# Patient Record
Sex: Female | Born: 1970 | Race: White | Hispanic: No | Marital: Married | Smoking: Current every day smoker
Health system: Southern US, Community
[De-identification: ages and names within clinical notes are randomized; demographics above are authoritative.]

## PROBLEM LIST (undated history)

## (undated) DIAGNOSIS — R42 Dizziness and giddiness: Secondary | ICD-10-CM

## (undated) DIAGNOSIS — J45909 Unspecified asthma, uncomplicated: Secondary | ICD-10-CM

## (undated) DIAGNOSIS — Z9889 Other specified postprocedural states: Secondary | ICD-10-CM

## (undated) DIAGNOSIS — C50919 Malignant neoplasm of unspecified site of unspecified female breast: Secondary | ICD-10-CM

## (undated) DIAGNOSIS — R413 Other amnesia: Secondary | ICD-10-CM

## (undated) DIAGNOSIS — Z9989 Dependence on other enabling machines and devices: Secondary | ICD-10-CM

## (undated) DIAGNOSIS — Z87898 Personal history of other specified conditions: Secondary | ICD-10-CM

## (undated) DIAGNOSIS — I951 Orthostatic hypotension: Secondary | ICD-10-CM

## (undated) DIAGNOSIS — R9089 Other abnormal findings on diagnostic imaging of central nervous system: Secondary | ICD-10-CM

## (undated) DIAGNOSIS — M1990 Unspecified osteoarthritis, unspecified site: Secondary | ICD-10-CM

## (undated) DIAGNOSIS — J189 Pneumonia, unspecified organism: Secondary | ICD-10-CM

## (undated) DIAGNOSIS — G459 Transient cerebral ischemic attack, unspecified: Secondary | ICD-10-CM

## (undated) DIAGNOSIS — E1140 Type 2 diabetes mellitus with diabetic neuropathy, unspecified: Secondary | ICD-10-CM

## (undated) DIAGNOSIS — K219 Gastro-esophageal reflux disease without esophagitis: Secondary | ICD-10-CM

## (undated) DIAGNOSIS — K589 Irritable bowel syndrome without diarrhea: Secondary | ICD-10-CM

## (undated) DIAGNOSIS — F418 Other specified anxiety disorders: Secondary | ICD-10-CM

## (undated) DIAGNOSIS — R600 Localized edema: Secondary | ICD-10-CM

## (undated) DIAGNOSIS — R011 Cardiac murmur, unspecified: Secondary | ICD-10-CM

## (undated) DIAGNOSIS — I219 Acute myocardial infarction, unspecified: Secondary | ICD-10-CM

## (undated) DIAGNOSIS — E119 Type 2 diabetes mellitus without complications: Secondary | ICD-10-CM

## (undated) DIAGNOSIS — R296 Repeated falls: Secondary | ICD-10-CM

## (undated) DIAGNOSIS — R569 Unspecified convulsions: Secondary | ICD-10-CM

## (undated) DIAGNOSIS — E669 Obesity, unspecified: Secondary | ICD-10-CM

## (undated) DIAGNOSIS — Z87442 Personal history of urinary calculi: Secondary | ICD-10-CM

## (undated) DIAGNOSIS — G43909 Migraine, unspecified, not intractable, without status migrainosus: Secondary | ICD-10-CM

## (undated) DIAGNOSIS — E66811 Obesity, class 1: Secondary | ICD-10-CM

## (undated) DIAGNOSIS — R112 Nausea with vomiting, unspecified: Secondary | ICD-10-CM

## (undated) DIAGNOSIS — F411 Generalized anxiety disorder: Secondary | ICD-10-CM

## (undated) DIAGNOSIS — Z8709 Personal history of other diseases of the respiratory system: Secondary | ICD-10-CM

## (undated) DIAGNOSIS — Z8619 Personal history of other infectious and parasitic diseases: Secondary | ICD-10-CM

## (undated) DIAGNOSIS — F410 Panic disorder [episodic paroxysmal anxiety] without agoraphobia: Secondary | ICD-10-CM

## (undated) HISTORY — PX: PLANTAR'S WART EXCISION: SHX2240

## (undated) HISTORY — DX: Gastro-esophageal reflux disease without esophagitis: K21.9

## (undated) HISTORY — DX: Obesity, unspecified: E66.9

## (undated) HISTORY — PX: ABDOMINAL HYSTERECTOMY: SHX81

## (undated) HISTORY — PX: CERVICAL CONE BIOPSY: SUR198

## (undated) HISTORY — PX: DIAGNOSTIC LAPAROSCOPY: SUR761

## (undated) HISTORY — DX: Generalized anxiety disorder: F41.1

## (undated) HISTORY — DX: Transient cerebral ischemic attack, unspecified: G45.9

## (undated) HISTORY — DX: Obesity, class 1: E66.811

## (undated) HISTORY — DX: Panic disorder (episodic paroxysmal anxiety): F41.0

## (undated) HISTORY — DX: Malignant neoplasm of unspecified site of unspecified female breast: C50.919

## (undated) HISTORY — DX: Type 2 diabetes mellitus without complications: E11.9

## (undated) HISTORY — PX: BREAST LUMPECTOMY: SHX2

## (undated) HISTORY — DX: Personal history of urinary calculi: Z87.442

## (undated) HISTORY — DX: Other abnormal findings on diagnostic imaging of central nervous system: R90.89

## (undated) HISTORY — PX: APPENDECTOMY: SHX54

## (undated) HISTORY — DX: Irritable bowel syndrome, unspecified: K58.9

## (undated) HISTORY — DX: Personal history of other diseases of the respiratory system: Z87.09

## (undated) HISTORY — DX: Personal history of other specified conditions: Z87.898

## (undated) HISTORY — DX: Migraine, unspecified, not intractable, without status migrainosus: G43.909

## (undated) HISTORY — DX: Other amnesia: R41.3

## (undated) HISTORY — DX: Type 2 diabetes mellitus with diabetic neuropathy, unspecified: E11.40

## (undated) HISTORY — PX: MULTIPLE TOOTH EXTRACTIONS: SHX2053

## (undated) HISTORY — PX: CHOLECYSTECTOMY: SHX55

## (undated) HISTORY — DX: Dizziness and giddiness: R42

## (undated) HISTORY — DX: Other specified anxiety disorders: F41.8

## (undated) HISTORY — DX: Repeated falls: R29.6

## (undated) HISTORY — DX: Orthostatic hypotension: I95.1

## (undated) HISTORY — PX: CYST REMOVAL HAND: SHX6279

## (undated) HISTORY — DX: Unspecified convulsions: R56.9

## (undated) HISTORY — DX: Localized edema: R60.0

## (undated) HISTORY — PX: TUBAL LIGATION: SHX77

---

## 1990-07-19 HISTORY — PX: APPENDECTOMY: SHX54

## 1990-07-19 HISTORY — PX: KNEE ARTHROSCOPY: SHX127

## 2001-07-19 HISTORY — PX: CHOLECYSTECTOMY: SHX55

## 2002-07-19 HISTORY — PX: ABDOMINAL HYSTERECTOMY: SHX81

## 2002-07-19 HISTORY — PX: ANKLE ARTHROPLASTY: SUR68

## 2011-07-03 NOTE — H&P (Signed)
Holly Adams is an 40 y.o. female.   Chief Complaint: Right wrist pain. HPI: Pt c/o work related pain to right wrist. She has had treatment for CRPS and is left with FCR pain and pain at the first dorsal compartment. She can not grip without pain and must wear a brace.  No past medical history on file.  No past surgical history on file.  No family history on file. Social History:  does not have a smoking history on file. She does not have any smokeless tobacco history on file. Her alcohol and drug histories not on file.  Allergies: Allergies not on file  No current facility-administered medications on file as of .   No current outpatient prescriptions on file as of .    No results found for this or any previous visit (from the past 48 hour(s)). No results found.  Review of Systems  Constitutional: Negative.   HENT: Negative.   Eyes: Negative.   Respiratory: Negative.   Cardiovascular: Negative.   Gastrointestinal: Negative.   Genitourinary: Negative.   Musculoskeletal: Negative.   Skin: Negative.   Neurological: Negative.   Psychiatric/Behavioral: Negative.     There were no vitals taken for this visit. Physical Exam  Constitutional: She is oriented to person, place, and time. She appears well-developed and well-nourished.  HENT:  Head: Normocephalic and atraumatic.  Eyes: Conjunctivae are normal. Pupils are equal, round, and reactive to light.  Neck: Normal range of motion.  Cardiovascular: Normal rate and regular rhythm.   Respiratory: Effort normal.  GI: Soft.  Musculoskeletal:       Right wrist pain over FCR. Positive Finkelstein test. Peri Jefferson n/v status  Neurological: She is alert and oriented to person, place, and time.  Skin: Skin is warm.  Psychiatric: She has a normal mood and affect.     Assessment/Plan Right first dorsal compartment tendonitis and FCR pain. Plan- is to release first dorsal comp and explore the FCR in an effort to reduce her  pain.  Rosslyn Pasion R 07/03/2011, 11:08 AM

## 2011-07-05 ENCOUNTER — Encounter (HOSPITAL_BASED_OUTPATIENT_CLINIC_OR_DEPARTMENT_OTHER): Payer: Self-pay | Admitting: *Deleted

## 2011-07-05 NOTE — H&P (Signed)
Holly Adams is an 40 y.o. female.   Chief Complaint: Right wrist pain HPI: Pt c/o of r wrist pain. This is a work related injury . She has failed nsaid and therapy and has continued r wrist pain. She has had an issue with CRPS requiring blocks. We have offered surgical release for thi problem.  Past Medical History  Diagnosis Date  . PONV (postoperative nausea and vomiting)   . Celiac disease   . History of shingles     Past Surgical History  Procedure Date  . Knee arthroscopy 1992    lt   . Abdominal hysterectomy   . Appendectomy   . Cholecystectomy   . Tubal ligation   . Diagnostic laparoscopy     tubal preg  . Ankle arthroplasty 2004    fx ankle-rods and pins-lt  . Plantar's wart excision   . Cervical cone biopsy     History reviewed. No pertinent family history. Social History:  reports that she has been smoking.  She does not have any smokeless tobacco history on file. She reports that she drinks alcohol. She reports that she does not use illicit drugs.  Allergies:  Allergies  Allergen Reactions  . Contrast Adams (Iodinated Diagnostic Agents) Anaphylaxis  . Latex Shortness Of Breath  . Oxycontin Shortness Of Breath  . Ibuprofen Swelling  . Other     Hydrogen peroxide--raw shin Celiac disease-no glutiens  . Zithromax (Azithromycin Dihydrate) Rash    No current facility-administered medications on file as of .   Medications Prior to Admission  Medication Sig Dispense Refill  . acyclovir (ZOVIRAX) 800 MG tablet Take 800 mg by mouth 5 (five) times daily. She had shingles months ago-felt like they were coming back-started on meds before blister showed up-no s/s       . HYDROcodone-acetaminophen (VICODIN) 5-500 MG per tablet Take 1 tablet by mouth every 6 (six) hours as needed.        . topiramate (TOPAMAX) 50 MG tablet Take 100 mg by mouth 2 (two) times daily.          No results found for this or any previous visit (from the past 48 hour(s)). No results  found.  Review of Systems  Constitutional: Negative.   HENT: Negative.   Eyes: Negative.   Respiratory: Negative.   Cardiovascular: Negative.   Gastrointestinal: Negative.   Genitourinary: Negative.   Musculoskeletal: Negative.   Skin: Negative.   Neurological: Negative.   Endo/Heme/Allergies: Negative.   Psychiatric/Behavioral: Negative.     There were no vitals taken for this visit. Physical Exam  Constitutional: She is oriented to person, place, and time. She appears well-developed and well-nourished.  HENT:  Head: Normocephalic and atraumatic.  Eyes: Conjunctivae are normal. Pupils are equal, round, and reactive to light.  Neck: Normal range of motion. Neck supple.  Cardiovascular: Normal rate and regular rhythm.   Respiratory: Effort normal and breath sounds normal.  GI: Soft. Bowel sounds are normal.  Musculoskeletal:       R wrist- pain in first dorsal comp. There is a positive finklestein test as well FCR pain  Neurological: She is alert and oriented to person, place, and time. She has normal reflexes.  Skin: Skin is warm and dry.  Psychiatric: She has a normal mood and affect.     Assessment/Plan r de Quervains and FCR pain PLAN-r wrist first dorsal comp release and FCR synovectomy to improve pt's pain  Lysha Schrade R 07/05/2011, 5:55 PM

## 2011-07-05 NOTE — Progress Notes (Signed)
Arrive 6am with boyfriend No labs needed

## 2011-07-06 ENCOUNTER — Encounter (HOSPITAL_BASED_OUTPATIENT_CLINIC_OR_DEPARTMENT_OTHER): Payer: Self-pay | Admitting: *Deleted

## 2011-07-06 ENCOUNTER — Ambulatory Visit (HOSPITAL_BASED_OUTPATIENT_CLINIC_OR_DEPARTMENT_OTHER): Payer: Worker's Compensation | Admitting: Anesthesiology

## 2011-07-06 ENCOUNTER — Encounter (HOSPITAL_BASED_OUTPATIENT_CLINIC_OR_DEPARTMENT_OTHER): Payer: Self-pay | Admitting: Anesthesiology

## 2011-07-06 ENCOUNTER — Encounter (HOSPITAL_BASED_OUTPATIENT_CLINIC_OR_DEPARTMENT_OTHER)
Admission: RE | Disposition: A | Discharge status: Home / Self Care | Payer: Self-pay | Source: Ambulatory Visit | Attending: Orthopaedic Surgery

## 2011-07-06 ENCOUNTER — Ambulatory Visit (HOSPITAL_BASED_OUTPATIENT_CLINIC_OR_DEPARTMENT_OTHER)
Admission: RE | Admit: 2011-07-06 | Discharge: 2011-07-06 | Disposition: A | Discharge status: Home / Self Care | Payer: Worker's Compensation | Source: Ambulatory Visit | Attending: Orthopaedic Surgery | Admitting: Orthopaedic Surgery

## 2011-07-06 DIAGNOSIS — Z01812 Encounter for preprocedural laboratory examination: Secondary | ICD-10-CM | POA: Insufficient documentation

## 2011-07-06 DIAGNOSIS — M65839 Other synovitis and tenosynovitis, unspecified forearm: Secondary | ICD-10-CM | POA: Insufficient documentation

## 2011-07-06 DIAGNOSIS — M654 Radial styloid tenosynovitis [de Quervain]: Secondary | ICD-10-CM | POA: Insufficient documentation

## 2011-07-06 HISTORY — DX: Personal history of other infectious and parasitic diseases: Z86.19

## 2011-07-06 HISTORY — DX: Other specified postprocedural states: R11.2

## 2011-07-06 HISTORY — DX: Other specified postprocedural states: Z98.890

## 2011-07-06 HISTORY — PX: DORSAL COMPARTMENT RELEASE: SHX5039

## 2011-07-06 LAB — POCT HEMOGLOBIN-HEMACUE: Hemoglobin: 14 g/dL (ref 12.0–15.0)

## 2011-07-06 SURGERY — RELEASE, FIRST DORSAL COMPARTMENT, HAND
Anesthesia: General | Site: Arm Lower | Laterality: Right | Wound class: Clean

## 2011-07-06 MED ORDER — LIDOCAINE HCL (CARDIAC) 20 MG/ML IV SOLN
INTRAVENOUS | Status: DC | PRN
  Administered 2011-07-06: 80 mg via INTRAVENOUS

## 2011-07-06 MED ORDER — MIDAZOLAM HCL 5 MG/5ML IJ SOLN
INTRAMUSCULAR | Status: DC | PRN
  Administered 2011-07-06: 2 mg via INTRAVENOUS

## 2011-07-06 MED ORDER — MEPERIDINE HCL 25 MG/ML IJ SOLN: 6.2500 mg | INTRAMUSCULAR | Status: DC | PRN

## 2011-07-06 MED ORDER — PROMETHAZINE HCL 25 MG/ML IJ SOLN: 6.2500 mg | INTRAMUSCULAR | Status: DC | PRN

## 2011-07-06 MED ORDER — BUPIVACAINE HCL (PF) 0.25 % IJ SOLN
INTRAMUSCULAR | Status: DC | PRN
  Administered 2011-07-06: 6 mL

## 2011-07-06 MED ORDER — FENTANYL CITRATE 0.05 MG/ML IJ SOLN
INTRAMUSCULAR | Status: DC | PRN
  Administered 2011-07-06 (×2): 50 ug via INTRAVENOUS
  Administered 2011-07-06: 100 ug via INTRAVENOUS

## 2011-07-06 MED ORDER — LACTATED RINGERS IV SOLN
INTRAVENOUS | Status: DC
  Administered 2011-07-06 (×2): via INTRAVENOUS

## 2011-07-06 MED ORDER — PROPOFOL 10 MG/ML IV EMUL
INTRAVENOUS | Status: DC | PRN
  Administered 2011-07-06: 200 mg via INTRAVENOUS

## 2011-07-06 MED ORDER — DEXAMETHASONE SODIUM PHOSPHATE 4 MG/ML IJ SOLN
INTRAMUSCULAR | Status: DC | PRN
  Administered 2011-07-06: 10 mg via INTRAVENOUS

## 2011-07-06 MED ORDER — CEFAZOLIN SODIUM 1-5 GM-% IV SOLN
INTRAVENOUS | Status: DC | PRN
  Administered 2011-07-06: 2 g via INTRAVENOUS

## 2011-07-06 MED ORDER — HYDROCODONE-ACETAMINOPHEN 5-325 MG PO TABS: 1.0000 | ORAL_TABLET | Freq: Four times a day (QID) | ORAL | Status: AC | PRN

## 2011-07-06 MED ORDER — HYDROMORPHONE HCL PF 1 MG/ML IJ SOLN
0.2500 mg | INTRAMUSCULAR | Status: DC | PRN
  Administered 2011-07-06 (×3): 0.5 mg via INTRAVENOUS

## 2011-07-06 MED ORDER — ONDANSETRON HCL 4 MG/2ML IJ SOLN
INTRAMUSCULAR | Status: DC | PRN
  Administered 2011-07-06: 4 mg via INTRAVENOUS

## 2011-07-06 SURGICAL SUPPLY — 57 items
BANDAGE ACE 4 STERILE (GAUZE/BANDAGES/DRESSINGS) ×2 IMPLANT
BANDAGE ELASTIC 3 VELCRO ST LF (GAUZE/BANDAGES/DRESSINGS) IMPLANT
BANDAGE ELASTIC 4 VELCRO ST LF (GAUZE/BANDAGES/DRESSINGS) IMPLANT
BANDAGE GAUZE ELAST BULKY 4 IN (GAUZE/BANDAGES/DRESSINGS) ×2 IMPLANT
BLADE MINI RND TIP GREEN BEAV (BLADE) ×2 IMPLANT
BLADE SURG 15 STRL LF DISP TIS (BLADE) ×1 IMPLANT
BLADE SURG 15 STRL SS (BLADE) ×1
BNDG ESMARK 4X9 LF (GAUZE/BANDAGES/DRESSINGS) IMPLANT
COVER MAYO STAND STRL (DRAPES) ×2 IMPLANT
COVER TABLE BACK 60X90 (DRAPES) ×2 IMPLANT
CUFF TOURNIQUET SINGLE 18IN (TOURNIQUET CUFF) IMPLANT
DECANTER SPIKE VIAL GLASS SM (MISCELLANEOUS) IMPLANT
DRAPE EXTREMITY T 121X128X90 (DRAPE) ×2 IMPLANT
DRAPE U-SHAPE 47X51 STRL (DRAPES) ×2 IMPLANT
DRSG EMULSION OIL 3X3 NADH (GAUZE/BANDAGES/DRESSINGS) ×2 IMPLANT
DURAPREP 26ML APPLICATOR (WOUND CARE) ×2 IMPLANT
ELECT REM PT RETURN 9FT ADLT (ELECTROSURGICAL) ×2
ELECTRODE REM PT RTRN 9FT ADLT (ELECTROSURGICAL) ×1 IMPLANT
GAUZE SPONGE 4X4 16PLY XRAY LF (GAUZE/BANDAGES/DRESSINGS) IMPLANT
GLOVE BIO SURGEON STRL SZ8.5 (GLOVE) IMPLANT
GLOVE BIOGEL PI IND STRL 7.0 (GLOVE) ×1 IMPLANT
GLOVE BIOGEL PI IND STRL 8 (GLOVE) ×1 IMPLANT
GLOVE BIOGEL PI IND STRL 8.5 (GLOVE) ×1 IMPLANT
GLOVE BIOGEL PI INDICATOR 7.0 (GLOVE) ×1
GLOVE BIOGEL PI INDICATOR 8 (GLOVE) ×1
GLOVE BIOGEL PI INDICATOR 8.5 (GLOVE) ×1
GLOVE SS BIOGEL STRL SZ 8 (GLOVE) IMPLANT
GLOVE SUPERSENSE BIOGEL SZ 8 (GLOVE)
GOWN PREVENTION PLUS XLARGE (GOWN DISPOSABLE) ×2 IMPLANT
GOWN PREVENTION PLUS XXLARGE (GOWN DISPOSABLE) ×2 IMPLANT
NEEDLE HYPO 25X1 1.5 SAFETY (NEEDLE) ×2 IMPLANT
NS IRRIG 1000ML POUR BTL (IV SOLUTION) ×2 IMPLANT
PACK BASIN DAY SURGERY FS (CUSTOM PROCEDURE TRAY) ×2 IMPLANT
PAD CAST 3X4 CTTN HI CHSV (CAST SUPPLIES) ×1 IMPLANT
PADDING CAST ABS 4INX4YD NS (CAST SUPPLIES) ×1
PADDING CAST ABS COTTON 4X4 ST (CAST SUPPLIES) ×1 IMPLANT
PADDING CAST COTTON 3X4 STRL (CAST SUPPLIES) ×1
PADDING WEBRIL 4 STERILE (GAUZE/BANDAGES/DRESSINGS) ×2 IMPLANT
PENCIL BUTTON HOLSTER BLD 10FT (ELECTRODE) IMPLANT
SHEET MEDIUM DRAPE 40X70 STRL (DRAPES) IMPLANT
SLEEVE SCD COMPRESS KNEE MED (MISCELLANEOUS) IMPLANT
SPLINT PLASTER CAST XFAST 3X15 (CAST SUPPLIES) IMPLANT
SPLINT PLASTER EXTRA FAST 3X15 (CAST SUPPLIES) ×1
SPLINT PLASTER GYPS XFAST 3X15 (CAST SUPPLIES) ×1 IMPLANT
SPLINT PLASTER XTRA FASTSET 3X (CAST SUPPLIES)
SPONGE GAUZE 4X4 12PLY (GAUZE/BANDAGES/DRESSINGS) ×2 IMPLANT
SPONGE GAUZE 4X4 16PLY NONSTR (GAUZE/BANDAGES/DRESSINGS) ×2 IMPLANT
STOCKINETTE 4X48 STRL (DRAPES) ×2 IMPLANT
SUT ETHILON 4 0 PS 2 18 (SUTURE) ×4 IMPLANT
SUT VIC AB 4-0 P-3 18XBRD (SUTURE) IMPLANT
SUT VIC AB 4-0 P3 18 (SUTURE)
SYR BULB 3OZ (MISCELLANEOUS) ×2 IMPLANT
SYR CONTROL 10ML LL (SYRINGE) ×2 IMPLANT
TOWEL OR 17X24 6PK STRL BLUE (TOWEL DISPOSABLE) ×4 IMPLANT
TOWEL OR NON WOVEN STRL DISP B (DISPOSABLE) ×2 IMPLANT
UNDERPAD 30X30 INCONTINENT (UNDERPADS AND DIAPERS) ×2 IMPLANT
WATER STERILE IRR 1000ML POUR (IV SOLUTION) ×2 IMPLANT

## 2011-07-06 NOTE — Anesthesia Preprocedure Evaluation (Signed)
Anesthesia Evaluation  Patient identified by MRN, date of birth, ID band Patient awake    History of Anesthesia Complications (+) PONV  Airway Mallampati: II TM Distance: >3 FB Neck ROM: full    Dental  (+) Teeth Intact and Dental Advidsory Given   Pulmonary neg pulmonary ROS,  clear to auscultation        Cardiovascular regular Normal    Neuro/Psych    GI/Hepatic negative GI ROS,   Endo/Other    Renal/GU      Musculoskeletal   Abdominal   Peds  Hematology   Anesthesia Other Findings   Reproductive/Obstetrics                           Anesthesia Physical Anesthesia Plan  ASA: II  Anesthesia Plan: General LMA   Post-op Pain Management:    Induction:   Airway Management Planned:   Additional Equipment:   Intra-op Plan:   Post-operative Plan:   Informed Consent: I have reviewed the patients History and Physical, chart, labs and discussed the procedure including the risks, benefits and alternatives for the proposed anesthesia with the patient or authorized representative who has indicated his/her understanding and acceptance.   Dental Advisory Given  Plan Discussed with: CRNA and Surgeon  Anesthesia Plan Comments:         Anesthesia Quick Evaluation

## 2011-07-06 NOTE — Brief Op Note (Signed)
Holly Adams [id number] 07/06/2011   PRE-OP DIAGNOSIS: right DeQuervains and FCR tendonitis  POST-OP DIAGNOSIS: same  PROCEDURE: right DeQ release and FCR debridement  ANESTHESIA: general  Nicky Kras G   Dictation #:  [id number]

## 2011-07-06 NOTE — Interval H&P Note (Signed)
History and Physical Interval Note:  07/06/2011 7:33 AM  Holly Adams  has presented today for surgery, with the diagnosis of right dequervains,  The various methods of treatment have been discussed with the patient and family. After consideration of risks, benefits and other options for treatment, the patient has consented to  Procedure(s): RELEASE DORSAL COMPARTMENT (DEQUERVAIN) as a surgical intervention .  The patients' history has been reviewed, patient examined, no change in status, stable for surgery.  I have reviewed the patients' chart and labs.  Questions were answered to the patient's satisfaction.     Arian Murley G

## 2011-07-06 NOTE — Anesthesia Procedure Notes (Signed)
Procedure Name: LMA Insertion Date/Time: 07/06/2011 7:45 AM Performed by: Signa Kell Pre-anesthesia Checklist: Patient identified, Emergency Drugs available, Suction available and Patient being monitored Patient Re-evaluated:Patient Re-evaluated prior to inductionOxygen Delivery Method: Circle System Utilized Preoxygenation: Pre-oxygenation with 100% oxygen Intubation Type: IV induction Ventilation: Mask ventilation without difficulty LMA: LMA inserted LMA Size: 4.0 Number of attempts: 1 Airway Equipment and Method: bite block Placement Confirmation: positive ETCO2 Tube secured with: Tape Dental Injury: Teeth and Oropharynx as per pre-operative assessment

## 2011-07-06 NOTE — H&P (View-Only) (Signed)
Arrive 6am with boyfriend No labs needed 

## 2011-07-06 NOTE — Transfer of Care (Signed)
Immediate Anesthesia Transfer of Care Note  Patient: Holly Adams  Procedure(s) Performed:  RELEASE DORSAL COMPARTMENT (DEQUERVAIN) - and FCR debridement  Patient Location: PACU  Anesthesia Type: General  Level of Consciousness: sedated  Airway & Oxygen Therapy: Patient Spontanous Breathing and Patient connected to face mask oxygen  Post-op Assessment: Report given to PACU RN and Post -op Vital signs reviewed and stable  Post vital signs: Reviewed and stable  Complications: No apparent anesthesia complications

## 2011-07-06 NOTE — Anesthesia Postprocedure Evaluation (Signed)
  Anesthesia Post-op Note  Patient: Holly Adams  Procedure(s) Performed:  RELEASE DORSAL COMPARTMENT (DEQUERVAIN) - and FCR debridement  Patient Location: PACU  Anesthesia Type: General  Level of Consciousness: awake  Airway and Oxygen Therapy: Patient Spontanous Breathing  Post-op Pain: mild  Post-op Assessment: Post-op Vital signs reviewed  Post-op Vital Signs: stable  Complications: No apparent anesthesia complications

## 2011-07-08 NOTE — Op Note (Signed)
NAME:  Holly Adams                  ACCOUNT NO.:  MEDICAL RECORD NO.:  [id number]  LOCATION:                                 FACILITY:  PHYSICIAN:  Lubertha Basque. Jerl Santos, M.D.     DATE OF BIRTH:  DATE OF PROCEDURE:  07/06/2011 DATE OF DISCHARGE:                              OPERATIVE REPORT   PREOPERATIVE DIAGNOSES: 1. Right wrist De Quervain tenosynovitis. 2. Right flexor carpi radialis tenosynovitis.  POSTOPERATIVE DIAGNOSES: 1. Right wrist De Quervain tenosynovitis. 2. Right flexor carpi radialis tenosynovitis.  PROCEDURES: 1. Right first dorsal compartment release. 2. Right FCR debridement.  ANESTHESIA:  General.  ATTENDING SURGEON:  Lubertha Basque. Jerl Santos, MD  ASSISTANT:  Lindwood Qua, PA  INDICATION FOR PROCEDURE:  The patient is a 39 year old woman with a long history of overuse injuries to the right hand.  She has also had some complicating pain syndromes.  She has been through extensive treatment by other professional and at this point, she is left with dorsal radial pain with a positive Finkelstein maneuver.  She has achieved transient relief with injections in that area.  She also has a volar aspect pain directly over the FCR.  She is offered a first dorsal compartment release along with an exploration of the FCR under the same anesthetic.  Informed operative consent was obtained after discussion of possible complications including reaction to anesthesia, infection, and neurovascular injury.  SUMMARY OF FINDINGS AND PROCEDURE:  Under general anesthesia, through 2 separate incisions, we performed 2 separate operations.  At the dorsal radial aspect, we made a small incision and decompressed 2 slips of tendon at the radial styloid in just proximal.  These were under some significant compression.  The tendons themselves appeared intact.  We then made an incision over the FCR and dissected down to the FCR tunnel. I decompressed the tendon and the tendon itself  looked relatively benign.  She was closed primarily and discharged home.  DESCRIPTION OF PROCEDURE:  The patient was taken to the operating suite where general anesthetic was applied without difficulty.  She was positioned supine, prepped and draped in normal sterile fashion.  After administration of IV Kefzol, the right arm was elevated, exsanguinated, tourniquet inflated about the upper arm.  We made a small incision near the radial styloid and dissected down to the first dorsal compartment. I then incised this thick compartment sharply with a knife and then extended the dissection with scissors by decompressing the APL and EPB tendons.  There was a single slip of each of these at this site.  This wound was then irrigated.  I then made a separate incision over the FCR with dissection down of this tendon.  I released some tight structures over the tendon consistent with an FCR tunnel.  This was taken distally towards the wrist joint.  I did not find any sort of cyst over the tendon and the tendon itself looked relatively benign.  This wound was then irrigated.  We released the tourniquet and bleeding was controlled with pressure both wounds.  The fingers became pink and warm immediately.  We again irrigated followed by closure of skin with nylon. I injected some  Marcaine about both incision sites followed by Adaptic, dry gauze, and a volar splint of plaster with the wrist in slight extension.  Estimated blood loss and intraoperative fluids can be obtained from anesthesia records, as can accurate tourniquet time.  DISPOSITION:  The patient was extubated in the operating room and taken to the recovery in stable condition.  She was to go home on same-day and follow up in the office in less than a week.  I will contact her by phone tonight.     Lubertha Basque Jerl Santos, M.D.     PGD/MEDQ  D:  07/06/2011  T:  07/06/2011  Job:  [id number]

## 2011-07-09 ENCOUNTER — Encounter (HOSPITAL_BASED_OUTPATIENT_CLINIC_OR_DEPARTMENT_OTHER): Payer: Self-pay | Admitting: Orthopaedic Surgery

## 2015-07-20 HISTORY — PX: BREAST LUMPECTOMY: SHX2

## 2018-11-13 ENCOUNTER — Encounter: Payer: Self-pay | Admitting: *Deleted

## 2018-11-14 ENCOUNTER — Ambulatory Visit (INDEPENDENT_AMBULATORY_CARE_PROVIDER_SITE_OTHER): Payer: Self-pay | Admitting: Neurology

## 2018-11-14 ENCOUNTER — Other Ambulatory Visit: Payer: Self-pay

## 2018-11-14 ENCOUNTER — Telehealth: Payer: Self-pay | Admitting: Neurology

## 2018-11-14 ENCOUNTER — Ambulatory Visit: Payer: Self-pay | Admitting: Neurology

## 2018-11-14 DIAGNOSIS — G43709 Chronic migraine without aura, not intractable, without status migrainosus: Secondary | ICD-10-CM

## 2018-11-14 DIAGNOSIS — IMO0002 Reserved for concepts with insufficient information to code with codable children: Secondary | ICD-10-CM

## 2018-11-14 DIAGNOSIS — R569 Unspecified convulsions: Secondary | ICD-10-CM

## 2018-11-14 DIAGNOSIS — R410 Disorientation, unspecified: Secondary | ICD-10-CM | POA: Insufficient documentation

## 2018-11-14 HISTORY — DX: Reserved for concepts with insufficient information to code with codable children: IMO0002

## 2018-11-14 HISTORY — DX: Disorientation, unspecified: R41.0

## 2018-11-14 MED ORDER — TOPIRAMATE 100 MG PO TABS
100.0000 mg | ORAL_TABLET | Freq: Two times a day (BID) | ORAL | 11 refills | Status: DC
Start: 2018-11-14 — End: 2018-11-14

## 2018-11-14 MED ORDER — LAMOTRIGINE 100 MG PO TABS: 100.0000 mg | ORAL_TABLET | Freq: Two times a day (BID) | ORAL | 11 refills | Status: DC

## 2018-11-14 MED ORDER — DIAZEPAM 5 MG PO TABS: 5.0000 mg | ORAL_TABLET | Freq: Four times a day (QID) | ORAL | 0 refills | Status: DC | PRN

## 2018-11-14 MED ORDER — TOPIRAMATE 25 MG PO TABS: 100.0000 mg | ORAL_TABLET | Freq: Two times a day (BID) | ORAL | 11 refills | Status: DC

## 2018-11-14 MED ORDER — LAMOTRIGINE 25 MG PO TABS: 25.0000 mg | ORAL_TABLET | Freq: Two times a day (BID) | ORAL | 0 refills | Status: DC

## 2018-11-14 NOTE — Telephone Encounter (Signed)
Deatra Canter from [facility] in [place] calling for clarification of instructions for Diazepam. Please call [phone]
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
## 2018-11-14 NOTE — Telephone Encounter (Signed)
Returned call to [place] at [facility] to clarify the diazepam instructions (Take 1-2 tablets thirty minutes prior to MRI.  May repeat with 1 additional tablet prior to entering scanner, if needed.  Must have driver).

## 2018-11-14 NOTE — Telephone Encounter (Signed)
self pay order faxed to [facility] they will contact the patient to schedule. Patient wanted open MRI.
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
## 2018-11-15 ENCOUNTER — Encounter: Payer: Self-pay | Admitting: Neurology

## 2018-11-15 DIAGNOSIS — R569 Unspecified convulsions: Secondary | ICD-10-CM | POA: Insufficient documentation

## 2018-11-15 HISTORY — DX: Unspecified convulsions: R56.9

## 2018-11-16 NOTE — Telephone Encounter (Signed)
patient is scheduled at [facility] for 11/24/18

## 2018-11-17 ENCOUNTER — Encounter: Payer: Self-pay | Admitting: Neurology

## 2018-11-29 ENCOUNTER — Telehealth: Payer: Self-pay | Admitting: Neurology

## 2018-11-29 ENCOUNTER — Other Ambulatory Visit: Payer: Self-pay | Admitting: *Deleted

## 2018-11-29 MED ORDER — SUMATRIPTAN SUCCINATE 100 MG PO TABS: ORAL_TABLET | ORAL | 1 refills | Status: DC

## 2018-11-29 NOTE — Telephone Encounter (Signed)
Spoke to patient - she is aware of her MRI results and verbalized understanding.  States her headaches have been worse.  She reports taking Excedrin Migraine, between 2-6 tablets per day.

## 2018-11-29 NOTE — Telephone Encounter (Addendum)
Please call patient  MRI of the brain with and without contrast from [facility] showed Arnold-Chiari malformation, which is a normal variant, otherwise normal

## 2018-11-29 NOTE — Telephone Encounter (Signed)
Per vo by Dr. Krista Blue, discontinue use of NSAIDS.  She has authorized a refill of her sumatriptan and would like her to use it for her rescue medication rather than Excedrin Migraine.  The patient is aware and verbalized understanding.

## 2019-08-24 ENCOUNTER — Other Ambulatory Visit: Payer: Self-pay | Admitting: Physician Assistant

## 2019-08-28 ENCOUNTER — Other Ambulatory Visit: Payer: Self-pay | Admitting: Physician Assistant

## 2019-09-14 ENCOUNTER — Other Ambulatory Visit: Payer: Self-pay

## 2019-09-18 ENCOUNTER — Encounter: Payer: Self-pay | Admitting: Internal Medicine

## 2019-09-18 ENCOUNTER — Other Ambulatory Visit: Payer: Self-pay

## 2019-09-18 ENCOUNTER — Ambulatory Visit: Payer: Self-pay | Admitting: Internal Medicine

## 2019-09-18 VITALS — BP 124/88 | HR 104 | Temp 98.6°F | Ht 63.0 in | Wt 212.2 lb

## 2019-09-18 DIAGNOSIS — E1142 Type 2 diabetes mellitus with diabetic polyneuropathy: Secondary | ICD-10-CM

## 2019-09-18 DIAGNOSIS — E781 Pure hyperglyceridemia: Secondary | ICD-10-CM

## 2019-09-18 DIAGNOSIS — E1165 Type 2 diabetes mellitus with hyperglycemia: Secondary | ICD-10-CM

## 2019-09-18 DIAGNOSIS — Z794 Long term (current) use of insulin: Secondary | ICD-10-CM

## 2019-09-18 NOTE — Patient Instructions (Signed)
-   Continue Lantus 50 units daily  - Regular insulin 18 units , thirty minutes before each meal  -  Regular  correctional insulin: ADD extra units on insulin to your meal-time  dose if your blood sugars are higher than 155 Use the scale below to help guide you:   Blood sugar before meal Number of units to inject  Less than 155 0 unit  156 -  180 1 units  181 -  205 2 units  206-  230 3 units  230 -  255 4 units  256 -  280 5 units  281 -  305 6 units  306 -  330 7 units  331 -  355 8 units      HOW TO TREAT LOW BLOOD SUGARS (Blood sugar LESS THAN 70 MG/DL)  Please follow the RULE OF 15 for the treatment of hypoglycemia treatment (when your (blood sugars are less than 70 mg/dL)    STEP 1: Take 15 grams of carbohydrates when your blood sugar is low, which includes:   3-4 GLUCOSE TABS  OR  3-4 OZ OF JUICE OR REGULAR SODA OR  ONE TUBE OF GLUCOSE GEL     STEP 2: RECHECK blood sugar in 15 MINUTES STEP 3: If your blood sugar is still low at the 15 minute recheck --> then, go back to STEP 1 and treat AGAIN with another 15 grams of carbohydrates.

## 2019-09-18 NOTE — Progress Notes (Signed)
Name: Holly Adams  MRN/ DOB: [id number], 1970-08-28   Age/ Sex: 49 y.o., female    PCP: Marge Duncans, PA-C   Reason for Endocrinology Evaluation: Type 2 Diabetes Mellitus     Date of Initial Endocrinology Visit: 09/19/2019     PATIENT IDENTIFIER: Holly Adams is a 49 y.o. female with a past medical history of T2Dm, Depression and Asthma The patient presented for initial endocrinology clinic visit on 09/19/2019 for consultative assistance with her diabetes management.    HPI: Ms. Karenann Cai was    Diagnosed with T2DM in 2005 Prior Medications tried/Intolerance: Metformin- did not work /  Currently checking blood sugars 3x / day,  before breakfast and bedtime.  Hypoglycemia episodes : no              Hemoglobin A1c has ranged from 5.8 %in 2017, peaking at 9.6% in 2020 Patient required assistance for hypoglycemia: no Patient has required hospitalization within the last 1 year from hyper or hypoglycemia: no  In terms of diet, the patient eats 3 meals and 3 snacks a day, avoids sugar sweetened beverages    HOME DIABETES REGIMEN: Novolin-R  14 units  Lantus 50 units daily    Statin: yes- started 05/2019 ACE-I/ARB: no  Prior Diabetic Education: no    METER DOWNLOAD SUMMARY: undownloadable    DIABETIC COMPLICATIONS: Microvascular complications:   Neuropathy  Denies: CKD   Last eye exam: Completed years ago   Macrovascular complications:    Denies: CAD, PVD, CVA   PAST HISTORY: Past Medical History:  Past Medical History:  Diagnosis Date  . Abnormal brain MRI   . Breast cancer ([place])    left lumpectomy  . Depression with anxiety   . Diabetes ([place])   . Diabetic neuropathy ([place])   . GERD (gastroesophageal reflux disease)   . History of asthma   . History of kidney stones    Reports three kidney stones 19+ years ago while she was on calcium supplements.  . History of shingles   . Memory loss   . Migraines   . PONV (postoperative nausea and vomiting)   .  Seizure-like activity ([place])   . TIA (transient ischemic attack)    Past Surgical History:  Past Surgical History:  Procedure Laterality Date  . ABDOMINAL HYSTERECTOMY    . ANKLE ARTHROPLASTY  2004   fx ankle-rods and pins-lt  . APPENDECTOMY    . BREAST LUMPECTOMY     left  . CERVICAL CONE BIOPSY    . CHOLECYSTECTOMY    . DIAGNOSTIC LAPAROSCOPY     tubal preg  . DORSAL COMPARTMENT RELEASE  07/06/2011   Procedure: RELEASE DORSAL COMPARTMENT (DEQUERVAIN);  Surgeon: Hessie Dibble, MD;  Location: [facility];  Service: Orthopedics;  Laterality: Right;  and FCR debridement  . KNEE ARTHROSCOPY  1992   lt   . PLANTAR'S WART EXCISION    . TUBAL LIGATION        Social History:  reports that she has been smoking cigarettes. She has been smoking about 1.00 pack per day. She has never used smokeless tobacco. She reports previous alcohol use. She reports that she does not use drugs. Family History:  Family History  Problem Relation Age of Onset  . Ovarian cancer Mother   . Depression Mother   . Alcoholism Mother   . Heart disease Father   . Hypertension Father   . Yves Dill Parkinson White syndrome Father      HOME MEDICATIONS: Allergies as  of 09/18/2019      Reactions   Contrast Media [iodinated Diagnostic Agents] Anaphylaxis   Latex Shortness Of Breath   Amitriptyline Other (See Comments)   "Caused her to roll repeatedly (fell out of the bed)."   Lyrica [pregabalin] Other (See Comments)   Sleep walking, night terrors   Other    Hydrogen peroxide--raw shin   Zithromax [azithromycin Dihydrate] Rash      Medication List       Accurate as of September 18, 2019 11:59 PM. If you have any questions, ask your nurse or doctor.        ALKA-SELTZER ANTACID PO Take 3-4 tablets by mouth daily.   b complex vitamins capsule Take 1 capsule by mouth daily.   diazepam 5 MG tablet Commonly known as: Valium Take 1 tablet (5 mg total) by mouth every 6 (six) hours as needed.  Take 1-2 tablets 30 minutes prior to MRI, may repeat once as needed. Must have driver.   DULoxetine 30 MG capsule Commonly known as: CYMBALTA Take 30 mg by mouth 2 (two) times daily.   DULoxetine 60 MG capsule Commonly known as: CYMBALTA TAKE ONE (1) CAPSULE BY MOUTH TWICE (2)  DAILY   Fish Oil 1000 MG Cpdr Take by mouth.   hydrochlorothiazide 25 MG tablet Commonly known as: HYDRODIURIL TAKE 1 TABLET BY MOUTH ONCE (1) DAILY AS NEEDED FOR swelling   ibuprofen 200 MG tablet Commonly known as: ADVIL Take 200 mg by mouth daily as needed. She takes up to six tablets per day.   insulin regular 100 units/mL injection Commonly known as: NOVOLIN R Inject 14 Units into the skin See admin instructions. She injects up to 10 units per her sliding scale instructions.   lamoTRIgine 100 MG tablet Commonly known as: LaMICtal Take 1 tablet (100 mg total) by mouth 2 (two) times daily. After she finished lamotrigine 25mg  titration dosage. What changed: Another medication with the same name was removed. Continue taking this medication, and follow the directions you see here. Changed by: Dorita Sciara, MD   LANTUS [place] Inject 50 Units as directed at bedtime.   Rosuvastatin Calcium 40 MG Cpsp Take by mouth.   SUMAtriptan 100 MG tablet Commonly known as: IMITREX Take 1 tab at onset of migraine.  May repeat in 2 hrs, if needed.  Max dose: 2 tabs/day or 9/month.   TYLENOL 500 MG tablet Generic drug: acetaminophen Take 500 mg by mouth daily as needed. She takes up to six per day.   VITAMIN B-1 PO Take by mouth.        ALLERGIES: Allergies  Allergen Reactions  . Contrast Media [Iodinated Diagnostic Agents] Anaphylaxis  . Latex Shortness Of Breath  . Amitriptyline Other (See Comments)    "Caused her to roll repeatedly (fell out of the bed)."  . Lyrica [Pregabalin] Other (See Comments)    Sleep walking, night terrors  . Other     Hydrogen peroxide--raw shin   . Zithromax  [Azithromycin Dihydrate] Rash     REVIEW OF SYSTEMS: A comprehensive ROS was conducted with the patient and is negative except as per HPI and below:  ROS    OBJECTIVE:   VITAL SIGNS: BP 124/88 (BP Location: Left Arm, Patient Position: Sitting, Cuff Size: Normal)   Pulse (!) 104   Temp 98.6 F (37 C)   Ht 5\' 3"  (1.6 m)   Wt 212 lb 3.2 oz (96.3 kg)   SpO2 99%   BMI 37.59 kg/m  PHYSICAL EXAM:  General: Pt appears well and is in NAD  HEENT:  Eyes: External eye exam normal without stare, lid lag or exophthalmos.  EOM intact.    Neck: General: Supple without adenopathy or carotid bruits. Thyroid: Thyroid size normal.  No goiter or nodules appreciated. No thyroid bruit.  Lungs: Clear with good BS bilat with no rales, rhonchi, or wheezes  Heart: RRR with normal S1 and S2 and no gallops; no murmurs; no rub  Abdomen: Normoactive bowel sounds, soft, nontender, without masses or organomegaly palpable  Extremities:  Lower extremities - No pretibial edema. No lesions.  Skin: Normal texture and temperature to palpation. No rash noted. No Acanthosis nigricans/skin tags. No lipohypertrophy.  Neuro: MS is good with appropriate affect, pt is alert and Ox3    DM foot exam: deferred   DATA REVIEWED: 03/23/2019 BUN/Cr 10/0.96 GFR 71 TG 1495 HDL 18 A1c 10.6%     ASSESSMENT / PLAN / RECOMMENDATIONS:   1) Type 2 Diabetes Mellitus, Poorly controlled, With Neuropathic complications - Most recent A1c of 9.6 %. Goal A1c < 7.0%.   Plan: GENERAL: I have discussed with the patient the pathophysiology of diabetes. We went over the natural progression of the disease. We talked about both insulin resistance and insulin deficiency. We stressed the importance of lifestyle changes including diet and exercise. I explained the complications associated with diabetes including retinopathy, nephropathy, neuropathy as well as increased risk of cardiovascular disease. We went over the benefit seen with  glycemic control.    I explained to the patient that diabetic patients are at higher than normal risk for amputations.   Based on PCP's notes it seems that her barriers are access to care, apparently she was rationing her medications, she pays cash for her lantus and regular insulin. I offered to switch to NPH but she tells me  She has no issues affording her medications, she also makes it seem that she eats a well balances and low carb diet ?  Discussed pharmacokinetics of basal/bolus insulin and the importance of taking prandial insulin with meals.   We also discussed avoiding sugar-sweetened beverages and snacks, when possible.    MEDICATIONS: - Continue Lantus 50 units daily  - Regular insulin 18 units , thirty minutes before each meal  - CF: Regular insulin (BG-130/25)  EDUCATION / INSTRUCTIONS:  BG monitoring instructions: Patient is instructed to check her blood sugars 3 times a day, before meals .  Call [facility] if: BG persistently < 70 or > 300. . I reviewed the Rule of 15 for the treatment of hypoglycemia in detail with the patient. Literature supplied.   2) Diabetic complications:   Eye: Unknown to have known diabetic retinopathy. Pt urged to have an eye exam  Neuro/ Feet: Does have known diabetic peripheral neuropathy.  Renal: Patient does not have known baseline CKD. She is not on an ACEI/ARB at present.   3) Lipids: Patient was started on statin 11/20210 due to dyslipidemia and elevated Tg > 1000 mg/dL. We discussed increased risk of pancreatitis with such numbers, I have strongly encouraged her to go on low fat diet.    F/u in 3 months    Signed electronically by: Mack Guise, MD  [facility]  [facility] [address] [place], [place] [address] Phone: [phone] FAX: [phone]   CC: [place] [address] [place] [address] Phone: [phone]  Fax:  [phone]  Return to Endocrinology clinic as below: Future Appointments  Date Time Provider [place]  01/23/2020  9:30 AM Eoghan Belcher, Melanie Crazier, MD [facility] None

## 2019-09-19 ENCOUNTER — Encounter: Payer: Self-pay | Admitting: Internal Medicine

## 2019-09-19 DIAGNOSIS — Z794 Long term (current) use of insulin: Secondary | ICD-10-CM | POA: Insufficient documentation

## 2019-09-19 DIAGNOSIS — E1142 Type 2 diabetes mellitus with diabetic polyneuropathy: Secondary | ICD-10-CM | POA: Insufficient documentation

## 2019-09-19 DIAGNOSIS — E781 Pure hyperglyceridemia: Secondary | ICD-10-CM | POA: Insufficient documentation

## 2019-09-19 DIAGNOSIS — E1165 Type 2 diabetes mellitus with hyperglycemia: Secondary | ICD-10-CM | POA: Insufficient documentation

## 2019-09-19 HISTORY — DX: Type 2 diabetes mellitus with diabetic polyneuropathy: E11.42

## 2019-09-19 HISTORY — DX: Pure hyperglyceridemia: E78.1

## 2019-09-19 HISTORY — DX: Type 2 diabetes mellitus with diabetic polyneuropathy: Z79.4

## 2019-10-16 ENCOUNTER — Other Ambulatory Visit: Payer: Self-pay

## 2019-10-16 MED ORDER — ROSUVASTATIN CALCIUM 40 MG PO CPSP: 1.0000 | ORAL_CAPSULE | Freq: Every day | ORAL | 0 refills | Status: DC

## 2019-10-16 MED ORDER — DULOXETINE HCL 60 MG PO CPEP: ORAL_CAPSULE | ORAL | 0 refills | Status: DC

## 2019-10-16 MED ORDER — HYDROCHLOROTHIAZIDE 25 MG PO TABS: ORAL_TABLET | ORAL | 0 refills | Status: DC

## 2019-10-16 MED ORDER — LAMOTRIGINE 100 MG PO TABS: 100.0000 mg | ORAL_TABLET | Freq: Two times a day (BID) | ORAL | 0 refills | Status: DC

## 2019-10-17 ENCOUNTER — Other Ambulatory Visit: Payer: Self-pay

## 2019-10-17 ENCOUNTER — Telehealth: Payer: Self-pay | Admitting: Internal Medicine

## 2019-10-17 DIAGNOSIS — Z794 Long term (current) use of insulin: Secondary | ICD-10-CM

## 2019-10-17 DIAGNOSIS — E1165 Type 2 diabetes mellitus with hyperglycemia: Secondary | ICD-10-CM

## 2019-10-17 MED ORDER — INSULIN GLARGINE 100 UNIT/ML ~~LOC~~ SOLN: 50.0000 [IU] | Freq: Every day | SUBCUTANEOUS | 6 refills | Status: DC

## 2019-10-17 NOTE — Telephone Encounter (Signed)
RX sent to [facility] in [place]

## 2019-10-17 NOTE — Telephone Encounter (Signed)
MEDICATION: Lantus  PHARMACY:  [facility] in [place] (PH#: [phone])  IS THIS A 90 DAY SUPPLY :   IS PATIENT OUT OF MEDICATION: No  IF NOT; HOW MUCH IS LEFT: 3 days  LAST APPOINTMENT DATE: @3 /08/2019  NEXT APPOINTMENT DATE:@7 /01/2020  DO WE HAVE YOUR PERMISSION TO LEAVE A DETAILED MESSAGE:  OTHER COMMENTS:    **Let patient know to contact pharmacy at the end of the day to make sure medication is ready. **  ** Please notify patient to allow 48-72 hours to process**  **Encourage patient to contact the pharmacy for refills or they can request refills through [facility]**

## 2019-10-23 ENCOUNTER — Other Ambulatory Visit: Payer: Self-pay | Admitting: Physician Assistant

## 2019-10-23 ENCOUNTER — Other Ambulatory Visit: Payer: Self-pay

## 2019-10-23 MED ORDER — DULOXETINE HCL 60 MG PO CPEP: ORAL_CAPSULE | ORAL | 0 refills | Status: DC

## 2019-10-23 MED ORDER — HYDROCHLOROTHIAZIDE 25 MG PO TABS: ORAL_TABLET | ORAL | 0 refills | Status: DC

## 2019-10-23 MED ORDER — ROSUVASTATIN CALCIUM 40 MG PO CPSP: 1.0000 | ORAL_CAPSULE | Freq: Every day | ORAL | 0 refills | Status: DC

## 2019-10-23 MED ORDER — LAMOTRIGINE 100 MG PO TABS: 100.0000 mg | ORAL_TABLET | Freq: Two times a day (BID) | ORAL | 0 refills | Status: AC

## 2019-11-09 ENCOUNTER — Other Ambulatory Visit: Payer: Self-pay | Admitting: Neurology

## 2019-11-09 DIAGNOSIS — G43709 Chronic migraine without aura, not intractable, without status migrainosus: Secondary | ICD-10-CM

## 2019-11-09 DIAGNOSIS — IMO0002 Reserved for concepts with insufficient information to code with codable children: Secondary | ICD-10-CM

## 2019-11-09 DIAGNOSIS — R410 Disorientation, unspecified: Secondary | ICD-10-CM

## 2019-12-11 ENCOUNTER — Ambulatory Visit: Payer: Self-pay | Admitting: Physician Assistant

## 2020-01-08 ENCOUNTER — Other Ambulatory Visit: Payer: Self-pay

## 2020-01-08 ENCOUNTER — Other Ambulatory Visit: Payer: Self-pay | Admitting: Physician Assistant

## 2020-01-08 MED ORDER — HYDROCHLOROTHIAZIDE 25 MG PO TABS: ORAL_TABLET | ORAL | 0 refills | Status: DC

## 2020-01-10 ENCOUNTER — Ambulatory Visit: Payer: Self-pay | Admitting: Physician Assistant

## 2020-01-22 NOTE — Progress Notes (Deleted)
Name: Holly Adams  Age/ Sex: 49 y.o., female   MRN/ DOB: [id number], 1970/12/31     PCP: Marge Duncans, PA-C   Reason for Endocrinology Evaluation: Type 2 Diabetes Mellitus  Initial Endocrine Consultative Visit: 09/18/2019    PATIENT IDENTIFIER: Holly Adams is a 49 y.o. female with a past medical history of T2DM, Depression, and Asthma. The patient has followed with Endocrinology clinic since 09/18/2019 for consultative assistance with management of her diabetes.  DIABETIC HISTORY:  Holly Adams was diagnosed with DM in 2005. Has been on metformin with persistent hyperglycemia, insulin was started years ago. Her hemoglobin A1c has ranged from 5.8 %in 2017, peaking at 9.6% in 2020   On her initial visit to our clinic, her A1c was 9.6 %, she was on lantus and regular insulin which was adjusted . SUBJECTIVE:   During the last visit (09/18/2019): A1c 9.6%, continued lantus and regular insulin , added a correctional scale   Today (01/22/2020): Holly Adams  She checks her blood sugars *** times daily, preprandial to breakfast and ***. The patient has *** had hypoglycemic episodes since the last clinic visit, which typically occur *** x / - most often occuring ***. The patient is *** symptomatic with these episodes, with symptoms of {symptoms; hypoglycemia:9084048}.      HOME DIABETES REGIMEN:    Statin: yes- started 05/2019 ACE-I/ARB: no   METER DOWNLOAD SUMMARY: Date range evaluated: *** Fingerstick Blood Glucose Tests = *** Average Number Tests/Day = *** Overall Mean FS Glucose = *** Standard Deviation = ***  BG Ranges: Low = *** High = ***   Hypoglycemic Events/30 Days: BG < 50 = *** Episodes of symptomatic severe hypoglycemia = ***    DIABETIC COMPLICATIONS: Microvascular complications:   Neuropathy  Denies: CKD Last Eye Exam: Completed yrs ago    Macrovascular complications:    Denies: CAD, CVA, PVD   HISTORY:  Past Medical History:  Past Medical  History:  Diagnosis Date  . Abnormal brain MRI   . Breast cancer ([place])    left lumpectomy  . Depression with anxiety   . Diabetes ([place])   . Diabetic neuropathy ([place])   . GERD (gastroesophageal reflux disease)   . History of asthma   . History of kidney stones    Reports three kidney stones 19+ years ago while she was on calcium supplements.  . History of shingles   . Memory loss   . Migraines   . PONV (postoperative nausea and vomiting)   . Seizure-like activity ([place])   . TIA (transient ischemic attack)     Past Surgical History:  Past Surgical History:  Procedure Laterality Date  . ABDOMINAL HYSTERECTOMY    . ANKLE ARTHROPLASTY  2004   fx ankle-rods and pins-lt  . APPENDECTOMY    . BREAST LUMPECTOMY     left  . CERVICAL CONE BIOPSY    . CHOLECYSTECTOMY    . DIAGNOSTIC LAPAROSCOPY     tubal preg  . DORSAL COMPARTMENT RELEASE  07/06/2011   Procedure: RELEASE DORSAL COMPARTMENT (DEQUERVAIN);  Surgeon: Hessie Dibble, MD;  Location: [facility];  Service: Orthopedics;  Laterality: Right;  and FCR debridement  . KNEE ARTHROSCOPY  1992   lt   . PLANTAR'S WART EXCISION    . TUBAL LIGATION       Social History:  reports that she has been smoking cigarettes. She has been smoking about 1.00 pack per day. She has never used smokeless tobacco. She reports previous  alcohol use. She reports that she does not use drugs. Family History:  Family History  Problem Relation Age of Onset  . Ovarian cancer Mother   . Depression Mother   . Alcoholism Mother   . Heart disease Father   . Hypertension Father   . Yves Dill Parkinson White syndrome Father       HOME MEDICATIONS: Allergies as of 01/23/2020      Reactions   Contrast Media [iodinated Diagnostic Agents] Anaphylaxis   Latex Shortness Of Breath   Amitriptyline Other (See Comments)   "Caused her to roll repeatedly (fell out of the bed)."   Lyrica [pregabalin] Other (See Comments)   Sleep walking, night terrors    Other    Hydrogen peroxide--raw shin   Zithromax [azithromycin Dihydrate] Rash      Medication List       Accurate as of January 22, 2020  1:34 PM. If you have any questions, ask your nurse or doctor.        ALKA-SELTZER ANTACID PO Take 3-4 tablets by mouth daily.   b complex vitamins capsule Take 1 capsule by mouth daily.   diazepam 5 MG tablet Commonly known as: Valium Take 1 tablet (5 mg total) by mouth every 6 (six) hours as needed. Take 1-2 tablets 30 minutes prior to MRI, may repeat once as needed. Must have driver.   DULoxetine 60 MG capsule Commonly known as: CYMBALTA TAKE ONE (1) CAPSULE BY MOUTH TWICE (2)  DAILY   Fish Oil 1000 MG Cpdr Take by mouth.   hydrochlorothiazide 25 MG tablet Commonly known as: HYDRODIURIL TAKE 1 TABLET BY MOUTH ONCE (1) DAILY AS NEEDED FOR swelling   ibuprofen 200 MG tablet Commonly known as: ADVIL Take 200 mg by mouth daily as needed. She takes up to six tablets per day.   insulin glargine 100 UNIT/ML injection Commonly known as: Lantus Inject 0.5 mLs (50 Units total) into the skin at bedtime.   insulin regular 100 units/mL injection Commonly known as: NOVOLIN R Inject 14 Units into the skin See admin instructions. She injects up to 10 units per her sliding scale instructions.   lamoTRIgine 100 MG tablet Commonly known as: LaMICtal Take 1 tablet (100 mg total) by mouth 2 (two) times daily.   Rosuvastatin Calcium 40 MG Cpsp Take 1 each by mouth daily.   SUMAtriptan 100 MG tablet Commonly known as: IMITREX Take 1 tab at onset of migraine.  May repeat in 2 hrs, if needed.  Max dose: 2 tabs/day or 9/month.   TYLENOL 500 MG tablet Generic drug: acetaminophen Take 500 mg by mouth daily as needed. She takes up to six per day.   VITAMIN B-1 PO Take by mouth.        OBJECTIVE:   Vital Signs: There were no vitals taken for this visit.  Wt Readings from Last 3 Encounters:  09/18/19 212 lb 3.2 oz (96.3 kg)  07/06/11 212  lb (96.2 kg)     Exam: General: Pt appears well and is in NAD  Hydration: Well-hydrated with moist mucous membranes and good skin turgor  HEENT: Head: Unremarkable with good dentition. Oropharynx clear without exudate.  Eyes: External eye exam normal without stare, lid lag or exophthalmos.  EOM intact.  PERRL.  Neck: General: Supple without adenopathy. Thyroid: Thyroid size normal.  No goiter or nodules appreciated. No thyroid bruit.  Lungs: Clear with good BS bilat with no rales, rhonchi, or wheezes  Heart: RRR with normal S1 and S2 and  no gallops; no murmurs; no rub  Abdomen: Normoactive bowel sounds, soft, nontender, without masses or organomegaly palpable  Extremities: No pretibial edema. No tremor. Normal strength and motion throughout. See detailed diabetic foot exam below.  Skin: Normal texture and temperature to palpation. No rash noted. No Acanthosis nigricans/skin tags. No lipohypertrophy.  Neuro: MS is good with appropriate affect, pt is alert and Ox3    DM foot exam: Please see diabetic assessment flow-sheet detailed below:           DATA REVIEWED:  03/23/2019 BUN/Cr 10/0.96 GFR 71 TG 1495 HDL 18 A1c 10.6%      ASSESSMENT / PLAN / RECOMMENDATIONS:   1) Type 2 Diabetes Mellitus, ***controlled, With neuropathic complications - Most recent A1c of *** %. Goal A1c < 7.0 %.    Plan: MEDICATIONS:  ***  EDUCATION / INSTRUCTIONS:  BG monitoring instructions: Patient is instructed to check her blood sugars *** times a day, ***.  Call [facility] if: BG persistently < 70 or > 300. . I reviewed the Rule of 15 for the treatment of hypoglycemia in detail with the patient. Literature supplied.  2) Diabetic complications:   Eye: Unknown to have known diabetic retinopathy. Pt urged to have an eye exam  Neuro/ Feet: Does have known diabetic peripheral neuropathy.  Renal: Patient does not have known baseline CKD. She is not on an ACEI/ARB at  present.   F/U in ***    Signed electronically by: Mack Guise, MD  [facility]  [facility] [address] [place], [place] [address] Phone: [phone] FAX: [phone]   CC: [place] [address] [place] [address] [place] [address] Phone: [phone]  Fax: [phone]  Return to Endocrinology clinic as below: Future Appointments  Date Time Provider [place]  01/23/2020  9:30 AM Dallon Dacosta, Melanie Crazier, MD [id number]  01/28/2020 10:00 AM Marge Duncans, PA-C [facility] None

## 2020-01-23 ENCOUNTER — Ambulatory Visit: Payer: Self-pay | Admitting: Internal Medicine

## 2020-01-28 ENCOUNTER — Ambulatory Visit: Payer: Self-pay | Admitting: Physician Assistant

## 2020-04-01 DIAGNOSIS — F32A Depression, unspecified: Secondary | ICD-10-CM

## 2020-04-01 DIAGNOSIS — M722 Plantar fascial fibromatosis: Secondary | ICD-10-CM

## 2020-04-01 DIAGNOSIS — K900 Celiac disease: Secondary | ICD-10-CM

## 2020-04-01 HISTORY — DX: Plantar fascial fibromatosis: M72.2

## 2020-04-01 HISTORY — DX: Depression, unspecified: F32.A

## 2020-04-01 HISTORY — DX: Celiac disease: K90.0

## 2020-05-06 ENCOUNTER — Encounter: Payer: Self-pay | Admitting: *Deleted

## 2020-05-06 ENCOUNTER — Encounter: Payer: Self-pay | Admitting: Cardiology

## 2020-05-19 ENCOUNTER — Ambulatory Visit: Payer: Self-pay | Admitting: Cardiology

## 2020-06-05 DIAGNOSIS — T148XXA Other injury of unspecified body region, initial encounter: Secondary | ICD-10-CM

## 2020-06-05 HISTORY — DX: Other injury of unspecified body region, initial encounter: T14.8XXA

## 2020-06-09 ENCOUNTER — Ambulatory Visit: Payer: Self-pay | Admitting: Cardiology

## 2020-06-19 ENCOUNTER — Other Ambulatory Visit: Payer: Self-pay

## 2020-06-19 ENCOUNTER — Ambulatory Visit (INDEPENDENT_AMBULATORY_CARE_PROVIDER_SITE_OTHER): Payer: BC Managed Care – PPO | Admitting: Cardiology

## 2020-06-19 VITALS — BP 105/73 | HR 102 | Ht 65.0 in | Wt 202.8 lb

## 2020-06-19 DIAGNOSIS — R011 Cardiac murmur, unspecified: Secondary | ICD-10-CM

## 2020-06-19 DIAGNOSIS — I259 Chronic ischemic heart disease, unspecified: Secondary | ICD-10-CM

## 2020-06-19 DIAGNOSIS — R42 Dizziness and giddiness: Secondary | ICD-10-CM | POA: Insufficient documentation

## 2020-06-19 DIAGNOSIS — E781 Pure hyperglyceridemia: Secondary | ICD-10-CM

## 2020-06-19 DIAGNOSIS — E669 Obesity, unspecified: Secondary | ICD-10-CM | POA: Insufficient documentation

## 2020-06-19 DIAGNOSIS — G43909 Migraine, unspecified, not intractable, without status migrainosus: Secondary | ICD-10-CM | POA: Insufficient documentation

## 2020-06-19 DIAGNOSIS — R600 Localized edema: Secondary | ICD-10-CM | POA: Insufficient documentation

## 2020-06-19 DIAGNOSIS — Z6833 Body mass index (BMI) 33.0-33.9, adult: Secondary | ICD-10-CM | POA: Insufficient documentation

## 2020-06-19 DIAGNOSIS — I951 Orthostatic hypotension: Secondary | ICD-10-CM | POA: Insufficient documentation

## 2020-06-19 DIAGNOSIS — I209 Angina pectoris, unspecified: Secondary | ICD-10-CM

## 2020-06-19 DIAGNOSIS — E1142 Type 2 diabetes mellitus with diabetic polyneuropathy: Secondary | ICD-10-CM | POA: Diagnosis not present

## 2020-06-19 DIAGNOSIS — Z23 Encounter for immunization: Secondary | ICD-10-CM

## 2020-06-19 DIAGNOSIS — Z87898 Personal history of other specified conditions: Secondary | ICD-10-CM | POA: Insufficient documentation

## 2020-06-19 DIAGNOSIS — R413 Other amnesia: Secondary | ICD-10-CM | POA: Insufficient documentation

## 2020-06-19 DIAGNOSIS — Z8619 Personal history of other infectious and parasitic diseases: Secondary | ICD-10-CM | POA: Insufficient documentation

## 2020-06-19 DIAGNOSIS — Z1329 Encounter for screening for other suspected endocrine disorder: Secondary | ICD-10-CM

## 2020-06-19 DIAGNOSIS — Z8709 Personal history of other diseases of the respiratory system: Secondary | ICD-10-CM | POA: Insufficient documentation

## 2020-06-19 DIAGNOSIS — G459 Transient cerebral ischemic attack, unspecified: Secondary | ICD-10-CM

## 2020-06-19 DIAGNOSIS — K219 Gastro-esophageal reflux disease without esophagitis: Secondary | ICD-10-CM | POA: Insufficient documentation

## 2020-06-19 DIAGNOSIS — K589 Irritable bowel syndrome without diarrhea: Secondary | ICD-10-CM | POA: Insufficient documentation

## 2020-06-19 DIAGNOSIS — Z794 Long term (current) use of insulin: Secondary | ICD-10-CM

## 2020-06-19 DIAGNOSIS — R9089 Other abnormal findings on diagnostic imaging of central nervous system: Secondary | ICD-10-CM | POA: Insufficient documentation

## 2020-06-19 DIAGNOSIS — R112 Nausea with vomiting, unspecified: Secondary | ICD-10-CM | POA: Insufficient documentation

## 2020-06-19 DIAGNOSIS — F410 Panic disorder [episodic paroxysmal anxiety] without agoraphobia: Secondary | ICD-10-CM | POA: Insufficient documentation

## 2020-06-19 DIAGNOSIS — F411 Generalized anxiety disorder: Secondary | ICD-10-CM | POA: Insufficient documentation

## 2020-06-19 DIAGNOSIS — Z87442 Personal history of urinary calculi: Secondary | ICD-10-CM | POA: Insufficient documentation

## 2020-06-19 DIAGNOSIS — F418 Other specified anxiety disorders: Secondary | ICD-10-CM | POA: Insufficient documentation

## 2020-06-19 DIAGNOSIS — R296 Repeated falls: Secondary | ICD-10-CM | POA: Insufficient documentation

## 2020-06-19 DIAGNOSIS — Z9889 Other specified postprocedural states: Secondary | ICD-10-CM | POA: Insufficient documentation

## 2020-06-19 DIAGNOSIS — C50919 Malignant neoplasm of unspecified site of unspecified female breast: Secondary | ICD-10-CM | POA: Insufficient documentation

## 2020-06-19 HISTORY — DX: Cardiac murmur, unspecified: R01.1

## 2020-06-19 HISTORY — DX: Angina pectoris, unspecified: I20.9

## 2020-06-19 MED ORDER — ICOSAPENT ETHYL 1 G PO CAPS: 2.0000 g | ORAL_CAPSULE | Freq: Two times a day (BID) | ORAL | 12 refills | Status: DC

## 2020-06-19 NOTE — Progress Notes (Signed)
Cardiology Office Note:    Date:  06/19/2020   ID:  Holly Adams, DOB Sep 13, 1970, MRN [id number]  PCP:  Leilani Able, FNP  Cardiologist:  Jenean Lindau, MD   Referring MD: Marge Duncans, PA-C    ASSESSMENT:    1. Hypertriglyceridemia   2. TIA (transient ischemic attack)   3. Type 2 diabetes mellitus with diabetic polyneuropathy, with long-term current use of insulin ([place])   4. Angina pectoris ([place])   5. Hypotension, postural   6. Cardiac murmur    PLAN:    In order of problems listed above:  1. Primary prevention stressed with the patient.  Importance of compliance with diet medication stressed and she vocalized understanding. 2. Angina pectoris: Her symptoms of chest pain are concerning.  She has extensive atherosclerotic vascular disease based on CT scan findings done in the past.  Her symptoms are very concerning.  For this reason I recommended Lexiscan sestamibi and patient is agreeable.  I would've offered her CT coronary angiography but she has anaphylactic reaction to dye.  She will take 5 mg midodrine on the day of the test in the morning. 3. Cardiac murmur: Echocardiogram will be done to assess murmur heard on auscultation. 4. Postural hypotension patient is agreeable to take midodrine on a regular basis and keep her self well-hydrated.  She has not had any passing out spells in the past several months. 5. Diet was discussed for diabetes mellitus. 6. Hypertriglyceridemia: Marked in nature.  I will initiate her on prescription strength fish oil Vascepa 2 g twice daily.  She will be back in follow-up appointment in 2 months or earlier if she has any concerns.  She knows to go to the nearest emergency room for any concerning symptoms.  Diet was emphasized.  We will do blood work before next visit.  Patient had multiple questions which were answered to her satisfaction.   Medication Adjustments/Labs and Tests Ordered: Current medicines are reviewed at length with the  patient today.  Concerns regarding medicines are outlined above.  No orders of the defined types were placed in this encounter.  No orders of the defined types were placed in this encounter.    History of Present Illness:    Holly Adams is a 49 y.o. female who is being seen today for the evaluation of postural hypotension and cardiac murmur and hypertriglyceridemia at the request of Marge Duncans, PA-C.  Patient is a pleasant 49 year old female.  She is on disability because of multiple medical issues.  She has orthopedic issues for which she ambulates with a cane.  She has history of diabetes mellitus and marked hypertriglyceridemia with triglycerides greater than 1500.  She mentions to me that she has not been great with her diet because of the fact that she has had teeth extraction and difficult to eat the right foot.  She eats soft foods.  She tells me that she has chest tightness at times this is not related to exertion.  She does not ambulate much.  She leads a sedentary lifestyle.  At the time of my evaluation, the patient is alert awake oriented and in no distress.  Past Medical History:  Diagnosis Date  . Abnormal brain MRI   . Bilateral lower extremity edema   . Breast cancer ([place])    left lumpectomy  . Celiac disease 04/01/2020  . Chronic migraine 11/14/2018  . Class 1 obesity with body mass index (BMI) of 33.0 to 33.9 in adult   . Confusion  11/14/2018  . Depression with anxiety   . Dizziness   . Frequent falls   . Generalized anxiety disorder with panic attacks   . GERD (gastroesophageal reflux disease)   . History of asthma   . History of kidney stones    Reports three kidney stones 19+ years ago while she was on calcium supplements.  . History of seizures   . History of shingles   . Hypertriglyceridemia 09/19/2019  . Hypotension, postural   . Irritable bowel   . Memory loss   . Migraines   . 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 PONV (postoperative nausea and vomiting)   .  Seizure-like activity ([place])   . TIA (transient ischemic attack)   . Type 2 diabetes mellitus with diabetic polyneuropathy, with long-term current use of insulin ([place]) 09/19/2019    Past Surgical History:  Procedure Laterality Date  . ABDOMINAL HYSTERECTOMY  2004  . ANKLE ARTHROPLASTY Left 2004   fx ankle-rods and pins-lt  . APPENDECTOMY  1992  . BREAST LUMPECTOMY  2017   left  . CERVICAL CONE BIOPSY    . CHOLECYSTECTOMY  2003  . CYST REMOVAL HAND Right   . DIAGNOSTIC LAPAROSCOPY     tubal preg  . DORSAL COMPARTMENT RELEASE  07/06/2011   Procedure: RELEASE DORSAL COMPARTMENT (DEQUERVAIN);  Surgeon: Hessie Dibble, MD;  Location: [facility];  Service: Orthopedics;  Laterality: Right;  and FCR debridement  . KNEE ARTHROSCOPY  1992   lt   . PLANTAR'S WART EXCISION    . TUBAL LIGATION      Current Medications: Current Meds  Medication Sig  . acetaminophen (TYLENOL) 500 MG tablet Take 500 mg by mouth daily as needed. She takes up to six per day.  . DULoxetine (CYMBALTA) 60 MG capsule TAKE ONE (1) CAPSULE BY MOUTH TWICE (2)  DAILY  . fenofibrate (TRICOR) 145 MG tablet Take 145 mg by mouth daily.  . Ibuprofen-Famotidine (DUEXIS) 800-26.6 MG TABS Take 1 tablet by mouth as needed.   . insulin glargine (LANTUS) 100 UNIT/ML injection Inject 0.5 mLs (50 Units total) into the skin at bedtime.  . insulin regular (NOVOLIN R) 100 units/mL injection Inject 14 Units into the skin See admin instructions. She injects up to 10 units per her sliding scale instructions.   Marland Kitchen ketoconazole (NIZORAL) 2 % cream Apply 1 application topically as needed.  . lamoTRIgine (LAMICTAL) 100 MG tablet Take 1 tablet (100 mg total) by mouth 2 (two) times daily.  Marland Kitchen linagliptin (TRADJENTA) 5 MG TABS tablet Take 5 mg by mouth daily.  . midodrine (PROAMATINE) 2.5 MG tablet Take 2.5 mg by mouth 3 (three) times daily as needed.  . Omega-3 Fatty Acids (FISH OIL) 1000 MG CPDR Take by mouth.  Marland Kitchen omeprazole  (PRILOSEC) 20 MG capsule Take 20 mg by mouth daily.  Marland Kitchen spironolactone (ALDACTONE) 25 MG tablet Take 25 mg by mouth daily.  . SUMAtriptan (IMITREX) 100 MG tablet Take 1 tab at onset of migraine.  May repeat in 2 hrs, if needed.  Max dose: 2 tabs/day or 9/month.  . topiramate (TOPAMAX) 100 MG tablet Take 100 mg by mouth as needed.  . venlafaxine (EFFEXOR) 37.5 MG tablet Take 37.5 mg by mouth daily.  . [DISCONTINUED] dapagliflozin propanediol (FARXIGA) 10 MG TABS tablet Take 10 mg by mouth daily.     Allergies:   Azithromycin, Contrast media [iodinated diagnostic agents], Latex, Amitriptyline, Gabapentin, Hydrogen peroxide, Lyrica [pregabalin], Other, and Zithromax [azithromycin dihydrate]   Social History  Socioeconomic History  . Marital status: Single    Spouse name: Not on file  . Number of children: 1  . Years of education: college  . Highest education level: Associate degree: academic program  Occupational History  . Occupation: Disabled  Tobacco Use  . Smoking status: Current Every Day Smoker    Packs/day: 1.00    Types: Cigarettes  . Smokeless tobacco: Never Used  Substance and Sexual Activity  . Alcohol use: Not Currently  . Drug use: No  . Sexual activity: Not on file  Other Topics Concern  . Not on file  Social History Narrative   2 cups caffeine per day.   Right-handed.   Lives at home with her significant other.   Social Determinants of Health   Financial Resource Strain:   . Difficulty of Paying Living Expenses: Not on file  Food Insecurity:   . Worried About Charity fundraiser in the Last Year: Not on file  . Ran Out of Food in the Last Year: Not on file  Transportation Needs:   . Lack of Transportation (Medical): Not on file  . Lack of Transportation (Non-Medical): Not on file  Physical Activity:   . Days of Exercise per Week: Not on file  . Minutes of Exercise per Session: Not on file  Stress:   . Feeling of Stress : Not on file  Social Connections:    . Frequency of Communication with Friends and Family: Not on file  . Frequency of Social Gatherings with Friends and Family: Not on file  . Attends Religious Services: Not on file  . Active Member of Clubs or Organizations: Not on file  . Attends Archivist Meetings: Not on file  . Marital Status: Not on file     Family History: The patient's family history includes Alcoholism in her mother; Depression in her mother; Heart disease in her father; Hypertension in her father; Ovarian cancer in her mother; Yves Dill Parkinson White syndrome in her father.  ROS:   Please see the history of present illness.    All other systems reviewed and are negative.  EKGs/Labs/Other Studies Reviewed:    The following studies were reviewed today: EKG reveals sinus rhythm and nonspecific ST-T changes.   Recent Labs: No results found for requested labs within last 8760 hours.  Recent Lipid Panel No results found for: CHOL, TRIG, HDL, CHOLHDL, VLDL, LDLCALC, LDLDIRECT  Physical Exam:    VS:  BP 105/73   Pulse (!) 102   Ht 5\' 5"  (1.651 m)   Wt 202 lb 12.8 oz (92 kg)   SpO2 97%   BMI 33.75 kg/m     Wt Readings from Last 3 Encounters:  06/19/20 202 lb 12.8 oz (92 kg)  04/25/20 202 lb (91.6 kg)  09/18/19 212 lb 3.2 oz (96.3 kg)     GEN: Patient is in no acute distress HEENT: Normal NECK: No JVD; No carotid bruits LYMPHATICS: No lymphadenopathy CARDIAC: S1 S2 regular, 2/6 systolic murmur at the apex. RESPIRATORY:  Clear to auscultation without rales, wheezing or rhonchi  ABDOMEN: Soft, non-tender, non-distended MUSCULOSKELETAL:  No edema; No deformity  SKIN: Warm and dry NEUROLOGIC:  Alert and oriented x 3 PSYCHIATRIC:  Normal affect    Signed, Jenean Lindau, MD  06/19/2020 10:11 AM    [place]

## 2020-06-19 NOTE — Patient Instructions (Addendum)
Medication Instructions:  Your physician has recommended you make the following change in your medication:   Start Vascepa 2 gms twice daily.  *If you need a refill on your cardiac medications before your next appointment, please call your pharmacy*   Lab Work: Your physician recommends that you return for lab work in: BMET 1 week before your CT scan. Your physician recommends that you return for lab work in: before your next visit. You need to have labs done when you are fasting.  You can come Monday through Friday 8:30 am to 12:00 pm and 1:15 to 4:30. You do not need to make an appointment as the order has already been placed. The labs you are going to have done are CBC, TSH, LFT and Lipids.   If you have labs (blood work) drawn today and your tests are completely normal, you will receive your results only by: Marland Kitchen MyChart Message (if you have MyChart) OR . A paper copy in the mail If you have any lab test that is abnormal or we need to change your treatment, we will call you to review the results.   Testing/Procedures: Your cardiac CT will be scheduled at:   [facility] [place], [place] [address] [phone]   If scheduled at [facility], please arrive at the [facility] main entrance of [facility] 30 minutes prior to test start time. Proceed to the [facility] Radiology Department (first floor) to check-in and test prep.  Please follow these instructions carefully (unless otherwise directed):  Hold all erectile dysfunction medications at least 3 days (72 hrs) prior to test.  On the Night Before the Test: . Be sure to Drink plenty of water. . Do not consume any caffeinated/decaffeinated beverages or chocolate 12 hours prior to your test. . Do not take any antihistamines 12 hours prior to your test. . If the patient has contrast allergy: ? Patient will need a prescription for Prednisone and very clear instructions (as  follows): 1. Prednisone 50 mg - take 13 hours prior to test 2. Take another Prednisone 50 mg 7 hours prior to test 3. Take another Prednisone 50 mg 1 hour prior to test 4. Take Benadryl 50 mg 1 hour prior to test . Patient must complete all four doses of above prophylactic medications. . Patient will need a ride after test due to Benadryl.  On the Day of the Test: . Drink plenty of water. Do not drink any water within one hour of the test. . Do not eat any food 4 hours prior to the test. . You may take your regular medications prior to the test.  . Take metoprolol (Lopressor) two hours prior to test. . HOLD Furosemide/Hydrochlorothiazide morning of the test. . FEMALES- please wear underwire-free bra if available   *For Clinical Staff only. Please instruct patient the following:*        -Drink plenty of water       -Hold Furosemide/hydrochlorothiazide morning of the test       -Take metoprolol (Lopressor) 2 hours prior to test (if applicable).                  -If HR is less than 55 BPM- No Lopressor                -IF HR is greater than 55 BPM and patient is less than or equal to 78 yrs old Lopressor $RemoveBefor'100mg'[id number]$  x1.                -  If HR is greater than 55 BPM and patient is greater than 1 yrs old Lopressor 50 mg x1.     Do not give Lopressor to patients with an allergy to lopressor or anyone with asthma or active COPD symptoms (currently taking steroids).       After the Test: . Drink plenty of water. . After receiving IV contrast, you may experience a mild flushed feeling. This is normal. . On occasion, you may experience a mild rash up to 24 hours after the test. This is not dangerous. If this occurs, you can take Benadryl 25 mg and increase your fluid intake. . If you experience trouble breathing, this can be serious. If it is severe call 911 IMMEDIATELY. If it is mild, please call our office. . If you take any of these medications: Glipizide/Metformin, Avandament, Glucavance,  please do not take 48 hours after completing test unless otherwise instructed.   Once we have confirmed authorization from your insurance company, we will call you to set up a date and time for your test. Based on how quickly your insurance processes prior authorizations requests, please allow up to 4 weeks to be contacted for scheduling your Cardiac CT appointment. Be advised that routine Cardiac CT appointments could be scheduled as many as 8 weeks after your provider has ordered it.  For non-scheduling related questions, please contact the cardiac imaging nurse navigator should you have any questions/concerns: Marchia Bond, Cardiac Imaging Nurse Navigator Burley Saver, Interim Cardiac Imaging Nurse [place] [facility] Direct Office Dial: [phone]   For scheduling needs, including cancellations and rescheduling, please call Vivien Rota at [phone].         Your physician has requested that you have an echocardiogram. Echocardiography is a painless test that uses sound waves to create images of your heart. It provides your doctor with information about the size and shape of your heart and how well your heart's chambers and valves are working. This procedure takes approximately one hour. There are no restrictions for this procedure.    Follow-Up: At [facility], you and your health needs are our priority.  As part of our continuing mission to provide you with exceptional heart care, we have created designated Provider Care Teams.  These Care Teams include your primary Cardiologist (physician) and Advanced Practice Providers (APPs -  Physician Assistants and Nurse Practitioners) who all work together to provide you with the care you need, when you need it.  We recommend signing up for the patient portal called "MyChart".  Sign up information is provided on this After Visit Summary.  MyChart is used to connect with patients for Virtual Visits (Telemedicine).   Patients are able to view lab/test results, encounter notes, upcoming appointments, etc.  Non-urgent messages can be sent to your provider as well.   To learn more about what you can do with MyChart, go to [email or web address].    Your next appointment:   2 month(s)  The format for your next appointment:   In Person  Provider:   Jyl Heinz, MD   Other Instructions Cardiac CT Angiogram A cardiac CT angiogram is a procedure to look at the heart and the area around the heart. It may be done to help find the cause of chest pains or other symptoms of heart disease. During this procedure, a substance called contrast dye is injected into the blood vessels in the area to be checked. A large X-ray machine, called a CT scanner, then takes detailed  pictures of the heart and the surrounding area. The procedure is also sometimes called a coronary CT angiogram, coronary artery scanning, or CTA. A cardiac CT angiogram allows the health care provider to see how well blood is flowing to and from the heart. The health care provider will be able to see if there are any problems, such as:  Blockage or narrowing of the coronary arteries in the heart.  Fluid around the heart.  Signs of weakness or disease in the muscles, valves, and tissues of the heart. Tell a health care provider about:  Any allergies you have. This is especially important if you have had a previous allergic reaction to contrast dye.  All medicines you are taking, including vitamins, herbs, eye drops, creams, and over-the-counter medicines.  Any blood disorders you have.  Any surgeries you have had.  Any medical conditions you have.  Whether you are pregnant or may be pregnant.  Any anxiety disorders, chronic pain, or other conditions you have that may increase your stress or prevent you from lying still. What are the risks? Generally, this is a safe procedure. However, problems may occur,  including: 1. Bleeding. 2. Infection. 3. Allergic reactions to medicines or dyes. 4. Damage to other structures or organs. 5. Kidney damage from the contrast dye that is used. 6. Increased risk of cancer from radiation exposure. This risk is low. Talk with your health care provider about: ? The risks and benefits of testing. ? How you can receive the lowest dose of radiation. What happens before the procedure? 1. Wear comfortable clothing and remove any jewelry, glasses, dentures, and hearing aids. 2. Follow instructions from your health care provider about eating and drinking. This may include: ? For 12 hours before the procedure -- avoid caffeine. This includes tea, coffee, soda, energy drinks, and diet pills. Drink plenty of water or other fluids that do not have caffeine in them. Being well hydrated can prevent complications. ? For 4-6 hours before the procedure -- stop eating and drinking. The contrast dye can cause nausea, but this is less likely if your stomach is empty. 3. Ask your health care provider about changing or stopping your regular medicines. This is especially important if you are taking diabetes medicines, blood thinners, or medicines to treat problems with erections (erectile dysfunction). What happens during the procedure?  1. Hair on your chest may need to be removed so that small sticky patches called electrodes can be placed on your chest. These will transmit information that helps to monitor your heart during the procedure. 2. An IV will be inserted into one of your veins. 3. You might be given a medicine to control your heart rate during the procedure. This will help to ensure that good images are obtained. 4. You will be asked to lie on an exam table. This table will slide in and out of the CT machine during the procedure. 5. Contrast dye will be injected into the IV. You might feel warm, or you may get a metallic taste in your mouth. 6. You will be given a medicine  called nitroglycerin. This will relax or dilate the arteries in your heart. 7. The table that you are lying on will move into the CT machine tunnel for the scan. 8. The person running the machine will give you instructions while the scans are being done. You may be asked to: ? Keep your arms above your head. ? Hold your breath. ? Stay very still, even if the table  is moving. 9. When the scanning is complete, you will be moved out of the machine. 10. The IV will be removed. The procedure may vary among health care providers and hospitals. What can I expect after the procedure? After your procedure, it is common to have:  A metallic taste in your mouth from the contrast dye.  A feeling of warmth.  A headache from the nitroglycerin. Follow these instructions at home:  Take over-the-counter and prescription medicines only as told by your health care provider.  If you are told, drink enough fluid to keep your urine pale yellow. This will help to flush the contrast dye out of your body.  Most people can return to their normal activities right after the procedure. Ask your health care provider what activities are safe for you.  It is up to you to get the results of your procedure. Ask your health care provider, or the department that is doing the procedure, when your results will be ready.  Keep all follow-up visits as told by your health care provider. This is important. Contact a health care provider if: 1. You have any symptoms of allergy to the contrast dye. These include: ? Shortness of breath. ? Rash or hives. ? A racing heartbeat. Summary  A cardiac CT angiogram is a procedure to look at the heart and the area around the heart. It may be done to help find the cause of chest pains or other symptoms of heart disease.  During this procedure, a large X-ray machine, called a CT scanner, takes detailed pictures of the heart and the surrounding area after a contrast dye has been  injected into blood vessels in the area.  Ask your health care provider about changing or stopping your regular medicines before the procedure. This is especially important if you are taking diabetes medicines, blood thinners, or medicines to treat erectile dysfunction.  If you are told, drink enough fluid to keep your urine pale yellow. This will help to flush the contrast dye out of your body. This information is not intended to replace advice given to you by your health care provider. Make sure you discuss any questions you have with your health care provider. Document Revised: 02/28/2019 Document Reviewed: 02/28/2019 Elsevier Patient Education  [place].   Echocardiogram An echocardiogram is a procedure that uses painless sound waves (ultrasound) to produce an image of the heart. Images from an echocardiogram can provide important information about:  Signs of coronary artery disease (CAD).  Aneurysm detection. An aneurysm is a weak or damaged part of an artery wall that bulges out from the normal force of blood pumping through the body.  Heart size and shape. Changes in the size or shape of the heart can be associated with certain conditions, including heart failure, aneurysm, and CAD.  Heart muscle function.  Heart valve function.  Signs of a past heart attack.  Fluid buildup around the heart.  Thickening of the heart muscle.  A tumor or infectious growth around the heart valves. Tell a health care provider about:  Any allergies you have.  All medicines you are taking, including vitamins, herbs, eye drops, creams, and over-the-counter medicines.  Any blood disorders you have.  Any surgeries you have had.  Any medical conditions you have.  Whether you are pregnant or may be pregnant. What are the risks? Generally, this is a safe procedure. However, problems may occur, including:  Allergic reaction to dye (contrast) that may be used during the  procedure. What happens before the procedure? No specific preparation is needed. You may eat and drink normally. What happens during the procedure?   An IV tube may be inserted into one of your veins.  You may receive contrast through this tube. A contrast is an injection that improves the quality of the pictures from your heart.  A gel will be applied to your chest.  A wand-like tool (transducer) will be moved over your chest. The gel will help to transmit the sound waves from the transducer.  The sound waves will harmlessly bounce off of your heart to allow the heart images to be captured in real-time motion. The images will be recorded on a computer. The procedure may vary among health care providers and hospitals. What happens after the procedure?  You may return to your normal, everyday life, including diet, activities, and medicines, unless your health care provider tells you not to do that. Summary  An echocardiogram is a procedure that uses painless sound waves (ultrasound) to produce an image of the heart.  Images from an echocardiogram can provide important information about the size and shape of your heart, heart muscle function, heart valve function, and fluid buildup around your heart.  You do not need to do anything to prepare before this procedure. You may eat and drink normally.  After the echocardiogram is completed, you may return to your normal, everyday life, unless your health care provider tells you not to do that. This information is not intended to replace advice given to you by your health care provider. Make sure you discuss any questions you have with your health care provider. Document Revised: 10/26/2018 Document Reviewed: 08/07/2016 Elsevier Patient Education  [place] ethyl capsules What is this medicine? ICOSAPENT ETHYL (eye KOE sa pent eth il) contains essential fats. It is used to treat high triglyceride levels. Diet and  lifestyle changes are often used with this drug. This medicine may be used for other purposes; ask your health care provider or pharmacist if you have questions. COMMON BRAND NAME(S): VASCEPA What should I tell my health care provider before I take this medicine? They need to know if you have any of these conditions:  bleeding disorders  liver disease  an unusual or allergic reaction to icosapent ethyl, fish, shellfish, other medicines, foods, dyes, or preservatives  history of irregular heartbeat  pregnant or trying to get pregnant  breast-feeding How should I use this medicine? Take this medicine by mouth with a glass of water. Follow the directions on the prescription label. Take this medicine with food. Do not cut, crush, dissolve, or chew this medicine. Take your medicine at regular intervals. Do not take it more often than directed. Do not stop taking except on your doctor's advice. Talk to your pediatrician regarding the use of this medicine in children. Special care may be needed. Overdosage: If you think you have taken too much of this medicine contact a poison control center or emergency room at once. NOTE: This medicine is only for you. Do not share this medicine with others. What if I miss a dose? If you miss a dose, take it as soon as you can. If it is almost time for your next dose, take only that dose. Do not take double or extra doses. What may interact with this medicine? This medicine may interact with the following medications:  aspirin and aspirin-like medicines  beta-blockers like metoprolol and propranolol  certain medicines that treat or  prevent blood clots like warfarin, enoxaparin, dalteparin, apixaban, dabigatran, and rivaroxaban  diuretics  female hormones, like estrogens and birth control pills This list may not describe all possible interactions. Give your health care provider a list of all the medicines, herbs, non-prescription drugs, or dietary  supplements you use. Also tell them if you smoke, drink alcohol, or use illegal drugs. Some items may interact with your medicine. What should I watch for while using this medicine? You may need blood work done while you are taking this medicine. Follow a good diet and exercise plan. Taking this medicine does not replace a healthy lifestyle. Some foods that have omega-3 fatty acids naturally are fatty fish like albacore tuna, halibut, herring, mackerel, lake trout, salmon, and sardines. If you are scheduled for any medical or dental procedure, tell your healthcare provider that you are taking this medicine. You may need to stop taking this medicine before the procedure. What side effects may I notice from receiving this medicine? Side effects that you should report to your doctor or health care professional as soon as possible:  allergic reactions like skin rash, itching or hives, swelling of the face, lips, or tongue  breathing problems  unusual bleeding or bruising  fast, irregular heartbeat Side effects that usually do not require medical attention (report to your doctor or health care professional if they continue or are bothersome):  muscle or joint pain  sore throat  swelling of the ankles, feet, hands  constipation  Gout This list may not describe all possible side effects. Call your doctor for medical advice about side effects. You may report side effects to FDA at 1-800-FDA-1088. Where should I keep my medicine? Keep out of the reach of children. Store at room temperature between 15 and 30 degrees C (59 and 86 degrees F). Throw away any unused medicine after the expiration date. NOTE: This sheet is a summary. It may not cover all possible information. If you have questions about this medicine, talk to your doctor, pharmacist, or health care provider.  2020 Elsevier/Gold Standard (2018-07-04 18:35:46)

## 2020-06-20 ENCOUNTER — Telehealth: Payer: Self-pay

## 2020-06-20 NOTE — Telephone Encounter (Signed)
Prior auth started on [facility] for Vascepa. Key: Wilburn Cornelia

## 2020-06-20 NOTE — Addendum Note (Signed)
Addended by: Truddie Hidden on: 06/20/2020 07:36 AM   Modules accepted: Orders

## 2020-06-23 DIAGNOSIS — R197 Diarrhea, unspecified: Secondary | ICD-10-CM | POA: Diagnosis not present

## 2020-06-23 DIAGNOSIS — E1121 Type 2 diabetes mellitus with diabetic nephropathy: Secondary | ICD-10-CM | POA: Diagnosis not present

## 2020-06-23 DIAGNOSIS — M2669 Other specified disorders of temporomandibular joint: Secondary | ICD-10-CM | POA: Diagnosis not present

## 2020-06-23 DIAGNOSIS — K1121 Acute sialoadenitis: Secondary | ICD-10-CM | POA: Diagnosis not present

## 2020-06-23 NOTE — Telephone Encounter (Signed)
PA approved for Vascepa through 06-20-21.

## 2020-06-25 ENCOUNTER — Telehealth: Payer: Self-pay | Admitting: Cardiology

## 2020-06-25 NOTE — Telephone Encounter (Signed)
Dawn from [facility] called and states she does not have orders for the patient's upcoming stress test. Please fax orders to [facility] at [phone]

## 2020-06-26 DIAGNOSIS — I209 Angina pectoris, unspecified: Secondary | ICD-10-CM | POA: Diagnosis not present

## 2020-06-26 DIAGNOSIS — I259 Chronic ischemic heart disease, unspecified: Secondary | ICD-10-CM | POA: Diagnosis not present

## 2020-06-26 DIAGNOSIS — R079 Chest pain, unspecified: Secondary | ICD-10-CM | POA: Diagnosis not present

## 2020-07-25 ENCOUNTER — Telehealth: Payer: Self-pay | Admitting: Cardiology

## 2020-07-25 NOTE — Telephone Encounter (Signed)
After calling patient to remind them of their echo on Monday she asked if she needed to have labs done. I looked at her last AVS from December and told her it had said to have labs done one week before her CT scan was to be performed. She said that they had cancelled it due to a previous reaction to dye, but she said that was years ago and thought she would be fine now- please call to advise on lab work and possible [place] number - [phone]

## 2020-07-25 NOTE — Telephone Encounter (Signed)
Spoke to the patient just now. She verbalizes to me that she is confused and is unsure on whether or not she was supposed to have the cardiac CT done. I let her know that upon reviewing her chart it appears that she was sent to [facility] for a stress test instead of doing the CT due to a contrast allergy that caused an anaphylaxis reaction. She verbalizes understanding and states that must be correct.    Encouraged patient to call back with any questions or concerns.

## 2020-07-28 ENCOUNTER — Other Ambulatory Visit: Payer: Self-pay

## 2020-07-29 DIAGNOSIS — L02214 Cutaneous abscess of groin: Secondary | ICD-10-CM | POA: Diagnosis not present

## 2020-07-29 DIAGNOSIS — Z8781 Personal history of (healed) traumatic fracture: Secondary | ICD-10-CM | POA: Diagnosis not present

## 2020-07-29 DIAGNOSIS — R197 Diarrhea, unspecified: Secondary | ICD-10-CM | POA: Diagnosis not present

## 2020-07-29 DIAGNOSIS — Z6832 Body mass index (BMI) 32.0-32.9, adult: Secondary | ICD-10-CM | POA: Diagnosis not present

## 2020-07-29 DIAGNOSIS — Z8619 Personal history of other infectious and parasitic diseases: Secondary | ICD-10-CM | POA: Diagnosis not present

## 2020-08-01 DIAGNOSIS — M4856XA Collapsed vertebra, not elsewhere classified, lumbar region, initial encounter for fracture: Secondary | ICD-10-CM | POA: Diagnosis not present

## 2020-08-01 DIAGNOSIS — M545 Low back pain, unspecified: Secondary | ICD-10-CM | POA: Diagnosis not present

## 2020-08-05 DIAGNOSIS — M4856XA Collapsed vertebra, not elsewhere classified, lumbar region, initial encounter for fracture: Secondary | ICD-10-CM | POA: Diagnosis not present

## 2020-08-11 ENCOUNTER — Ambulatory Visit: Payer: Self-pay | Admitting: Orthopedic Surgery

## 2020-08-11 DIAGNOSIS — E669 Obesity, unspecified: Secondary | ICD-10-CM | POA: Diagnosis not present

## 2020-08-11 DIAGNOSIS — E1121 Type 2 diabetes mellitus with diabetic nephropathy: Secondary | ICD-10-CM | POA: Diagnosis not present

## 2020-08-11 DIAGNOSIS — Z79890 Hormone replacement therapy (postmenopausal): Secondary | ICD-10-CM | POA: Diagnosis not present

## 2020-08-11 DIAGNOSIS — S32019A Unspecified fracture of first lumbar vertebra, initial encounter for closed fracture: Secondary | ICD-10-CM | POA: Diagnosis not present

## 2020-08-12 ENCOUNTER — Ambulatory Visit: Payer: Self-pay | Admitting: Orthopedic Surgery

## 2020-08-12 NOTE — H&P (Signed)
Subjective:   Location: bilateral (buttock and low back) Quality: constant; worsening Severity: pain level 8/10 Duration: DOI- 07-12-20 Timing: chronic Context: bending (to pick up dog) Alleviating Factors: heat; narcotics (Hydrocodone 5 and 10 mg - PCP) Aggravating Factors: standing; lying down; walking; morning; nighttime Associated Symptoms: tingling (neuropathy in feet) Previous Surgery: none (for the back) Prior Imaging: MRI ([facility]) Work Related: no Working: no  Patient Active Problem List   Diagnosis Date Noted  . Angina pectoris ([place]) 06/19/2020  . Cardiac murmur 06/19/2020  . Abnormal brain MRI   . Bilateral lower extremity edema   . Breast cancer ([place])   . Class 1 obesity with body mass index (BMI) of 33.0 to 33.9 in adult   . Depression with anxiety   . Dizziness   . Frequent falls   . Generalized anxiety disorder with panic attacks   . GERD (gastroesophageal reflux disease)   . History of asthma   . History of kidney stones   . History of seizures   . History of shingles   . Hypotension, postural   . Irritable bowel   . Memory loss   . Migraines   . PONV (postoperative nausea and vomiting)   . TIA (transient ischemic attack)   . Eschar of wound bed 06/05/2020  . Celiac disease 04/01/2020  . Depression 04/01/2020  . 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 Type 2 diabetes mellitus with diabetic polyneuropathy, with long-term current use of insulin ([place]) 09/19/2019  . Hypertriglyceridemia 09/19/2019  . Seizure-like activity ([place]) 11/15/2018  . Confusion 11/14/2018  . Chronic migraine 11/14/2018   Past Medical History:  Diagnosis Date  . Abnormal brain MRI   . Bilateral lower extremity edema   . Breast cancer ([place])    left lumpectomy  . Celiac disease 04/01/2020  . Chronic migraine 11/14/2018  . Class 1 obesity with body mass index (BMI) of 33.0 to 33.9 in adult   . Confusion 11/14/2018  . Depression with anxiety   . Dizziness   . Frequent falls   .  Generalized anxiety disorder with panic attacks   . GERD (gastroesophageal reflux disease)   . History of asthma   . History of kidney stones    Reports three kidney stones 19+ years ago while she was on calcium supplements.  . History of seizures   . History of shingles   . Hypertriglyceridemia 09/19/2019  . Hypotension, postural   . Irritable bowel   . Memory loss   . Migraines   . 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 PONV (postoperative nausea and vomiting)   . Seizure-like activity ([place])   . TIA (transient ischemic attack)   . Type 2 diabetes mellitus with diabetic polyneuropathy, with long-term current use of insulin ([place]) 09/19/2019    Past Surgical History:  Procedure Laterality Date  . ABDOMINAL HYSTERECTOMY  2004  . ANKLE ARTHROPLASTY Left 2004   fx ankle-rods and pins-lt  . APPENDECTOMY  1992  . BREAST LUMPECTOMY  2017   left  . CERVICAL CONE BIOPSY    . CHOLECYSTECTOMY  2003  . CYST REMOVAL HAND Right   . DIAGNOSTIC LAPAROSCOPY     tubal preg  . DORSAL COMPARTMENT RELEASE  07/06/2011   Procedure: RELEASE DORSAL COMPARTMENT ([place]);  Surgeon: Hessie Dibble, MD;  Location: [facility];  Service: Orthopedics;  Laterality: Right;  and FCR debridement  . KNEE ARTHROSCOPY  1992   lt   . PLANTAR'S WART EXCISION    . TUBAL LIGATION  Current Outpatient Medications  Medication Sig Dispense Refill Last Dose  . acetaminophen (TYLENOL) 500 MG tablet Take 500 mg by mouth every 4 (four) hours as needed (pain.).     Marland Kitchen DULoxetine (CYMBALTA) 60 MG capsule TAKE ONE (1) CAPSULE BY MOUTH TWICE (2)  DAILY (Patient taking differently: Take 60 mg by mouth in the morning and at bedtime. TAKE ONE (1) CAPSULE BY MOUTH TWICE (2)  DAILY) 180 capsule 0   . fenofibrate (TRICOR) 145 MG tablet Take 145 mg by mouth at bedtime.     Marland Kitchen HYDROcodone-acetaminophen (NORCO) 10-325 MG tablet Take 1 tablet by mouth every 8 (eight) hours as needed (pain.).     Marland Kitchen ibuprofen (ADVIL) 800 MG  tablet Take 800 mg by mouth in the morning and at bedtime.     . Ibuprofen-Famotidine 800-26.6 MG TABS Take 1 tablet by mouth 2 (two) times daily as needed (pain/neuropathy).     Marland Kitchen icosapent Ethyl (VASCEPA) 1 g capsule Take 2 capsules (2 g total) by mouth 2 (two) times daily. (Patient not taking: No sig reported) 120 capsule 12   . insulin glargine (LANTUS) 100 UNIT/ML injection Inject 0.5 mLs (50 Units total) into the skin at bedtime. 10 mL 6   . insulin regular (NOVOLIN R) 100 units/mL injection Inject 0-24 Units into the skin 4 (four) times daily -  before meals and at bedtime. Sliding Scale Insulin     . lamoTRIgine (LAMICTAL) 100 MG tablet Take 1 tablet (100 mg total) by mouth 2 (two) times daily. (Patient taking differently: Take 100 mg by mouth 2 (two) times daily. Breakfast & bedtime) 180 tablet 0   . linagliptin (TRADJENTA) 5 MG TABS tablet Take 5 mg by mouth daily.     . midodrine (PROAMATINE) 2.5 MG tablet Take 2.5 mg by mouth 3 (three) times daily as needed.     . midodrine (PROAMATINE) 5 MG tablet Take 5 mg by mouth in the morning and at bedtime.     . Omega-3 Fatty Acids (FISH OIL) 1000 MG CPDR Take 1,000 mg by mouth in the morning and at bedtime. Breakfast & bedtime     . omeprazole (PRILOSEC) 20 MG capsule Take 20 mg by mouth daily with breakfast.     . Probiotic Product (PROBIOTIC PO) Take 1 capsule by mouth in the morning and at bedtime.     . rosuvastatin (CRESTOR) 20 MG tablet Take 20 mg by mouth daily with lunch.     . spironolactone (ALDACTONE) 25 MG tablet Take 25 mg by mouth daily with breakfast.     . SUMAtriptan (IMITREX) 100 MG tablet Take 1 tab at onset of migraine.  May repeat in 2 hrs, if needed.  Max dose: 2 tabs/day or 9/month. (Patient taking differently: Take 100 mg by mouth as directed. Take 1 tab at onset of migraine.  May repeat in 2 hrs, if needed.  Max dose: 2 tabs/day or 9/month.) 27 tablet 1   . Vancomycin HCl (VANCOCIN PO) Take 125 mg by mouth 4 (four) times  daily. ~6 days remaining on course     . venlafaxine XR (EFFEXOR-XR) 37.5 MG 24 hr capsule Take 37.5 mg by mouth daily with lunch.      No current facility-administered medications for this visit.   Allergies  Allergen Reactions  . Azithromycin Rash and Shortness Of Breath  . Contrast Media [Iodinated Diagnostic Agents] Anaphylaxis  . Latex Shortness Of Breath  . Amitriptyline Other (See Comments)    "Caused her  to roll repeatedly (fell out of the bed)."  . Gabapentin Other (See Comments)    Pt reports lowers BP and causes dizziness  . Hydrogen Peroxide Other (See Comments)    RASH  . Lyrica [Pregabalin] Other (See Comments)    Sleep walking, night terrors  . Other     Hydrogen peroxide--raw shin     Social History   Tobacco Use  . Smoking status: Current Every Day Smoker    Packs/day: 1.00    Types: Cigarettes  . Smokeless tobacco: Never Used  Substance Use Topics  . Alcohol use: Not Currently    Family History  Problem Relation Age of Onset  . Ovarian cancer Mother   . Depression Mother   . Alcoholism Mother   . Heart disease Father   . Hypertension Father   . Yves Dill Parkinson White syndrome Father     Review of Systems Pertinent items are noted in HPI.  Objective:   Vitals: Ht: 5 ft 4 in Wt: 198 lbs BMI: 34  General: AAOX3, well developed and well nourished, Tearful, unable to find a comfortable position Ambulation: antalgic gait pattern, uses cane assistive device. Heart: Regular rate and rhythm, no rubs, murmurs, or gallops Lungs: Clear auscultation bilaterally Abdomen: Bowel sounds 4, nondistended, nontender, no rebound tenderness Inspection: No obvious deformity Palpation: Extremely tender to palpation at approximately the L1 level. AROM: - Significant pain with range of motion of lumbar spine - Knee: flexion and extension normal and pain free bilaterally. - Ankle: Dorsiflexion, plantarflexion, inversion, eversion normal and pain free. Dermatomes:  Lower extremity sensation to light touch intact bilaterally Myotomes: - Difficult to assess lower extremity motor function due to patient's significantly pain. No obvious lower extremity motor deficit  Reflexes: - Babinski: Left Ngative, Right Negative - Clonus: Negative Special Tests: - Straight Leg Raise: Left Negative, Right Negative  PV: Extremities warm and well profused. Posterior and dorsalis pedis pulse 2+ bilaterally, No pitting Edema, discoloration, calf tenderness  X-Ray impression: AP, lateral, spot lumbar films were taken at today's office visit and images were personally reviewed by me. There is a time of the terminal L1 compression deformity, this is not appear to be further collapse compared to the MRI taken a few days prior. No other fractures seen. Normal alignment, no significant scoliosis, no significant slip. Disc space height generally well maintained without any significant collapse.  MRI Impression: MRI of lumbar spine performed on 08/01/2020. Both images as well as the report were personally reviewed by me. There is an acute L1 compression fracture with 25% height loss and no significant retropulsion. Mild lumbar spondylosis with mild spinal canal and right neural foraminal stenosis at L4-5. Conus extends to the L2 level conus and cauda equina appear normal.  Assessment:   Holly Adams is a pleasant 50 year old female with past medical history significant for diabetes (last A1c was 10) and chronic low back pain who presents today with worsening back pain due to a L1 compression fracture. She states on Christmas day she bent down and was holding her dog's front feet when she felt a pop in her back. Her chief complaint is significant pain especially with range of motion just below her bra strap area. She is tender to palpation at approximately L1. Fortunately she does not have any radicular symptoms. Imaging studies do reveal an acute L1 compression deformity. Given that her pain is  severe even despite narcotic pain medication, I do think moving forward with the kyphoplasty procedure is warranted.  Plan:  Risks of surgery include: Infection, bleeding, death, stroke, paralysis, nerve damage, leak of cement, need for additional surgery including open decompression. Ongoing or worse pain. Goals of surgery: Reduction in pain, and improvement in quality of life  Recommend metabolic bone density workup by an endocrinologist. I will defer this referral to the patient's primary care provider.  We discussed that the patient needs to have better diabetic control and that her elevated A1c can increase her risk of surgical complications including infection and wound healing. Despite her elevated diabetes, she is in significant pain and we will plan on moving forward with the procedure given the slightly increased risk. Patient did express understanding of this.  We will plan on moving forward with preoperative medical clearance from the patient's primary care provider.  Follow-up: 2 weeks postop

## 2020-08-13 ENCOUNTER — Encounter (HOSPITAL_COMMUNITY): Payer: Self-pay | Admitting: Physician Assistant

## 2020-08-13 ENCOUNTER — Other Ambulatory Visit: Payer: Self-pay

## 2020-08-13 ENCOUNTER — Encounter (HOSPITAL_COMMUNITY): Payer: Self-pay | Admitting: Orthopedic Surgery

## 2020-08-13 NOTE — Progress Notes (Signed)
Anesthesia Chart Review:  Patient recently referred to cardiology by her PCP for evaluation of postural hypotension, cardiac murmur, hypertriglyceridemia.  She was seen by Dr. Geraldo Pitter on 06/19/2020.  Per note, the patient was also reporting symptoms of nonexertional chest pain.  Patient has a low baseline functional status, uses a cane due to multiple orthopedic issues, does not ambulate much.  Dr. Geraldo Pitter ordered a nuclear stress test as well as an echocardiogram.  Nuclear stress test was done at [facility], records requested and reviewed, result was nonischemic, EF 75%, low risk.  It does not appear the patient has had the echocardiogram.  History of seizures, maintained on Lamictal.  Uncontrolled IDDM2.  Per preop H&P by Cleta Alberts, PA-C, patient's last reported A1c was 10.  Reviewed with Dr. Tobias Alexander, based on low risk nuclear stress okay to proceed as planned barring acute status change.  Patient will need day of surgery labs and evaluation.  EKG 06/19/20: Sinus tachycardia.  Rate 105.  Nuclear stress 06/24/2020 (copy on chart): Impression: 1.  No significant reversible ischemia or infarction. 2.  Normal left ventricular wall motion. 3.  Left ventricular ejection fraction 75%  4.  Noninvasive risk stratification: Low.    Wynonia Musty [facility]/Anesthesiology Phone [phone] 08/14/2020 9:19 AM

## 2020-08-13 NOTE — Progress Notes (Signed)
PCP - Clydie Braun. FNP  Cardiologist - Dr Sunny Schlein Revankar  Chest x-ray - DOS 08/14/20 EKG - 06/19/20 Stress Test - 06/26/20 (results on chart) ECHO - 06/19/20 Cardiac Cath - n/a  Fasting Blood Sugar - 200s-240s Checks Blood Sugar 3 times a day  . THE NIGHT BEFORE SURGERY (Wed), take 25 units of Lantus Insulin  Do not take bedtime dose of Novolin R Wed. Night.   . THE MORNING OF SURGERY (Thurs), do not take Novolin R Insulin unless your CBG is greater than 220 mg/dL.  If greater than 220, you may take  of your sliding scale (correction) dose of insulin.     . If your blood sugar is less than 70 mg/dL, you will need to treat for low blood sugar: o Treat a low blood sugar (less than 70 mg/dL) with  cup of clear juice (cranberry or apple), 4 glucose tablets, OR glucose gel. o Recheck blood sugar in 15 minutes after treatment (to make sure it is greater than 70 mg/dL). If your blood sugar is not greater than 70 mg/dL on recheck, call [phone] for further instructions.  Anesthesia review: Yes  STOP now taking any Aspirin (unless otherwise instructed by your surgeon), Aleve, Naproxen, Ibuprofen, Motrin, Advil, Goody's, BC's, all herbal medications, fish oil, and all vitamins.   Coronavirus Screening Covid test on DOS 08/14/20 Do you have any of the following symptoms:  Cough yes/no: No Fever (>100.63F)  yes/no: No Runny nose yes/no: No Sore throat yes/no: No Difficulty breathing/shortness of breath  yes/no: No  Have you traveled in the last 14 days and where? yes/no: No  Patient verbalized understanding of instructions that were given via phone.

## 2020-08-14 ENCOUNTER — Ambulatory Visit (HOSPITAL_COMMUNITY)
Admission: RE | Admit: 2020-08-14 | Payer: BC Managed Care – PPO | Source: Home / Self Care | Admitting: Orthopedic Surgery

## 2020-08-14 HISTORY — DX: Pneumonia, unspecified organism: J18.9

## 2020-08-14 HISTORY — DX: Acute myocardial infarction, unspecified: I21.9

## 2020-08-14 HISTORY — DX: Cardiac murmur, unspecified: R01.1

## 2020-08-14 HISTORY — DX: Dependence on other enabling machines and devices: Z99.89

## 2020-08-14 HISTORY — DX: Unspecified osteoarthritis, unspecified site: M19.90

## 2020-08-14 HISTORY — DX: Unspecified asthma, uncomplicated: J45.909

## 2020-08-14 SURGERY — KYPHOPLASTY
Anesthesia: Monitor Anesthesia Care

## 2020-08-21 ENCOUNTER — Other Ambulatory Visit: Payer: Self-pay

## 2020-08-21 DIAGNOSIS — J45909 Unspecified asthma, uncomplicated: Secondary | ICD-10-CM | POA: Insufficient documentation

## 2020-08-21 DIAGNOSIS — M1990 Unspecified osteoarthritis, unspecified site: Secondary | ICD-10-CM | POA: Insufficient documentation

## 2020-08-21 DIAGNOSIS — Z9989 Dependence on other enabling machines and devices: Secondary | ICD-10-CM | POA: Insufficient documentation

## 2020-08-21 DIAGNOSIS — I219 Acute myocardial infarction, unspecified: Secondary | ICD-10-CM | POA: Insufficient documentation

## 2020-08-21 DIAGNOSIS — J189 Pneumonia, unspecified organism: Secondary | ICD-10-CM | POA: Insufficient documentation

## 2020-08-21 DIAGNOSIS — R011 Cardiac murmur, unspecified: Secondary | ICD-10-CM | POA: Insufficient documentation

## 2020-08-27 ENCOUNTER — Ambulatory Visit: Payer: Self-pay | Admitting: Cardiology

## 2020-08-29 ENCOUNTER — Ambulatory Visit: Payer: Self-pay | Admitting: Orthopedic Surgery

## 2020-09-02 ENCOUNTER — Encounter (HOSPITAL_COMMUNITY)
Admission: RE | Admit: 2020-09-02 | Discharge: 2020-09-02 | Disposition: A | Discharge status: Home / Self Care | Payer: BC Managed Care – PPO | Source: Ambulatory Visit | Attending: Orthopedic Surgery | Admitting: Orthopedic Surgery

## 2020-09-02 ENCOUNTER — Other Ambulatory Visit (HOSPITAL_COMMUNITY)
Admission: RE | Admit: 2020-09-02 | Discharge: 2020-09-02 | Disposition: A | Discharge status: Home / Self Care | Payer: BC Managed Care – PPO | Source: Ambulatory Visit | Attending: Orthopedic Surgery | Admitting: Orthopedic Surgery

## 2020-09-02 ENCOUNTER — Ambulatory Visit (HOSPITAL_COMMUNITY)
Admission: RE | Admit: 2020-09-02 | Discharge: 2020-09-02 | Disposition: A | Discharge status: Home / Self Care | Payer: BC Managed Care – PPO | Source: Ambulatory Visit | Attending: Orthopedic Surgery | Admitting: Orthopedic Surgery

## 2020-09-02 ENCOUNTER — Encounter (HOSPITAL_COMMUNITY): Payer: Self-pay

## 2020-09-02 ENCOUNTER — Other Ambulatory Visit: Payer: Self-pay

## 2020-09-02 DIAGNOSIS — Z20822 Contact with and (suspected) exposure to covid-19: Secondary | ICD-10-CM | POA: Diagnosis not present

## 2020-09-02 DIAGNOSIS — Z01818 Encounter for other preprocedural examination: Secondary | ICD-10-CM

## 2020-09-02 DIAGNOSIS — M4856XA Collapsed vertebra, not elsewhere classified, lumbar region, initial encounter for fracture: Secondary | ICD-10-CM | POA: Diagnosis not present

## 2020-09-02 LAB — CBC
HCT: 48.5 % — ABNORMAL HIGH (ref 36.0–46.0)
Hemoglobin: 17.5 g/dL — ABNORMAL HIGH (ref 12.0–15.0)
MCH: 31.1 pg (ref 26.0–34.0)
MCHC: 36.1 g/dL — ABNORMAL HIGH (ref 30.0–36.0)
MCV: 86.1 fL (ref 80.0–100.0)
Platelets: 238 10*3/uL (ref 150–400)
RBC: 5.63 MIL/uL — ABNORMAL HIGH (ref 3.87–5.11)
RDW: 11.9 % (ref 11.5–15.5)
WBC: 13.4 10*3/uL — ABNORMAL HIGH (ref 4.0–10.5)
nRBC: 0 % (ref 0.0–0.2)

## 2020-09-02 LAB — PROTIME-INR
INR: 1 (ref 0.8–1.2)
Prothrombin Time: 12.5 seconds (ref 11.4–15.2)

## 2020-09-02 LAB — BASIC METABOLIC PANEL
Anion gap: 14 (ref 5–15)
BUN: 8 mg/dL (ref 6–20)
CO2: 24 mmol/L (ref 22–32)
Calcium: 9.3 mg/dL (ref 8.9–10.3)
Chloride: 93 mmol/L — ABNORMAL LOW (ref 98–111)
Creatinine, Ser: 0.84 mg/dL (ref 0.44–1.00)
GFR, Estimated: >60 mL/min (ref >60)
Glucose, Bld: 307 mg/dL — ABNORMAL HIGH (ref 70–99)
Potassium: 3.3 mmol/L — ABNORMAL LOW (ref 3.5–5.1)
Sodium: 131 mmol/L — ABNORMAL LOW (ref 135–145)

## 2020-09-02 LAB — URINALYSIS, ROUTINE W REFLEX MICROSCOPIC
Bilirubin Urine: NEGATIVE
Glucose, UA: >=500 mg/dL — AB
Hgb urine dipstick: NEGATIVE
Ketones, ur: NEGATIVE mg/dL
Nitrite: POSITIVE — AB
Protein, ur: NEGATIVE mg/dL
Specific Gravity, Urine: 1.01 (ref 1.005–1.030)
pH: 6 (ref 5.0–8.0)

## 2020-09-02 LAB — URINALYSIS, MICROSCOPIC (REFLEX): WBC, UA: >50 WBC/hpf (ref 0–5)

## 2020-09-02 LAB — GLUCOSE, CAPILLARY: Glucose-Capillary: 322 mg/dL — ABNORMAL HIGH (ref 70–99)

## 2020-09-02 LAB — SARS CORONAVIRUS 2 (TAT 6-24 HRS): SARS Coronavirus 2: NEGATIVE

## 2020-09-02 LAB — APTT: aPTT: 23 seconds — ABNORMAL LOW (ref 24–36)

## 2020-09-02 NOTE — Pre-Procedure Instructions (Signed)
Holly Adams  09/02/2020    Your procedure is scheduled on Wednesday, September 03, 2020 at 10:30 AM.   Report to [facility] Entrance "A" Admitting Office at 8:30 AM.   Call this number if you have problems the morning of surgery: [phone]   Remember:  Do not eat food after midnight tonight.   You may drink clear liquids until 7:30 AM. Clear liquids allowed are:  Water, Juice (non-citric and without pulp - diabetics please choose diet or no sugar options), Carbonated beverages - (diabetics please choose diet or no sugar options), Clear Tea, Black Coffee only (no creamer, milk or cream including half and half) and Gatorade (diabetics please choose diet or no sugar options)    Take these medicines the morning of surgery with A SIP OF WATER: Duloxetine (Cymbalta), Lamotrigine (Lamictal), Omeprazole (Prilosec), Premarin, Hydrocodone (Norco) - if needed  Stop NSAIDS (Ibuprofen, Aleve, etc) and Fish Oil as of today. Do not use Aspirin containing products, Multivitamins or Herbal medications prior to surgery.  Tonight, take 1/2 of your regular dose of Lantus Insulin, you will take 25 units. Do NOT take your bedtime dose of Novolin R insulin tonight.  In the morning check your blood sugar when you get up and every 2 hours until you leave for the hospital. If blood sugar is >220 take 1/2 of usual correction dose of Novolin R insulin. If blood sugar is 70 or below, treat with 1/2 cup of clear juice (apple or cranberry) and recheck blood sugar 15 minutes after drinking juice. If blood sugar continues to be 70 or below, call the Short Stay department and ask to speak to a nurse.  Do not take Tradjenta morning of surgery.    Do not wear jewelry, make-up or nail polish.  Do not wear lotions, powders, perfumes or deodorant.  Do not shave 48 hours prior to surgery.   Do not bring valuables to the hospital.  [facility] is not responsible for any belongings or valuables.  Contacts, dentures or  bridgework may not be worn into surgery.  Leave your suitcase in the car.  After surgery it may be brought to your room.  For patients admitted to the hospital, discharge time will be determined by your treatment team.  Patients discharged the day of surgery will not be allowed to drive home.   [facility] - Preparing for Surgery  Before surgery, you can play an important role.  Because skin is not sterile, your skin needs to be as free of germs as possible.  You can reduce the number of germs on you skin by washing with CHG (chlorahexidine gluconate) soap before surgery.  CHG is an antiseptic cleaner which kills germs and bonds with the skin to continue killing germs even after washing.  Oral Hygiene is also important in reducing the risk of infection.  Remember to brush your teeth with your regular toothpaste the morning of surgery.  Please DO NOT use if you have an allergy to CHG or antibacterial soaps.  If your skin becomes reddened/irritated stop using the CHG and inform your nurse when you arrive at Short Stay.  Do not shave (including legs and underarms) for at least 48 hours prior to the first CHG shower.  You may shave your face.  Please follow these instructions carefully:   1.  Shower with CHG Soap the night before surgery and the morning of Surgery.  2.  If you choose to wash your hair, wash your hair first  as usual with your normal shampoo.  3.  After you shampoo, rinse your hair and body thoroughly to remove the shampoo. 4.  Use CHG as you would any other liquid soap.  You can apply chg directly to the skin and wash gently with a      scrungie or washcloth.           5.  Apply the CHG Soap to your body ONLY FROM THE NECK DOWN.   Do not use on open wounds or open sores. Avoid contact with your eyes, ears, mouth and genitals (private parts).  Wash genitals (private parts) with your normal soap - do this prior to using CHG soap.  6.  Wash thoroughly, paying special attention to the  area where your surgery will be performed.  7.  Thoroughly rinse your body with warm water from the neck down.  8.  DO NOT shower/wash with your normal soap after using and rinsing off the CHG Soap.  9.  Pat yourself dry with a clean towel.            10.  Wear clean pajamas.            11.  Place clean sheets on your bed the night of your first shower and do not sleep with pets.  Day of Surgery  Shower as above. Do not apply any lotions/deod0rants the morning of surgery.   Please wear clean clothes to the hospital. Remember to brush your teeth with toothpaste.  Please read over the fact sheets that you were given.

## 2020-09-02 NOTE — Progress Notes (Signed)
PCP - Clydie Braun, FNP Cardiologist - Dr. Geraldo Pitter  Chest x-ray - today EKG - 06/19/20 Stress Test - ordered 06/19/20 ECHO - 06/19/20  Fasting Blood Sugar - 220-317, CBG today was 322 (states she didn't take her Novolin R this morning Checks Blood Sugar ___4 __ times a day   ERAS Protcol - yes, per Anesthesia protocol    COVID TEST- today   Anesthesia review: yes, per request of Dr. Rolena Infante  Patient denies shortness of breath, fever, cough and chest pain at PAT appointment   All instructions explained to the patient, with a verbal understanding of the material. Patient agrees to go over the instructions while at home for a better understanding. Patient also instructed to self quarantine after being tested for COVID-19. The opportunity to ask questions was provided.

## 2020-09-03 ENCOUNTER — Encounter (HOSPITAL_COMMUNITY): Payer: Self-pay | Admitting: Orthopedic Surgery

## 2020-09-03 ENCOUNTER — Ambulatory Visit (HOSPITAL_COMMUNITY): Payer: BC Managed Care – PPO

## 2020-09-03 ENCOUNTER — Ambulatory Visit (HOSPITAL_COMMUNITY): Payer: BC Managed Care – PPO | Admitting: Anesthesiology

## 2020-09-03 ENCOUNTER — Ambulatory Visit (HOSPITAL_COMMUNITY)
Admission: RE | Admit: 2020-09-03 | Discharge: 2020-09-03 | Disposition: A | Discharge status: Home / Self Care | Payer: BC Managed Care – PPO | Attending: Orthopedic Surgery | Admitting: Orthopedic Surgery

## 2020-09-03 ENCOUNTER — Other Ambulatory Visit: Payer: Self-pay

## 2020-09-03 ENCOUNTER — Ambulatory Visit (HOSPITAL_COMMUNITY): Payer: BC Managed Care – PPO | Admitting: Vascular Surgery

## 2020-09-03 ENCOUNTER — Encounter (HOSPITAL_COMMUNITY)
Admission: RE | Disposition: A | Discharge status: Home / Self Care | Payer: Self-pay | Source: Home / Self Care | Attending: Orthopedic Surgery

## 2020-09-03 DIAGNOSIS — Z794 Long term (current) use of insulin: Secondary | ICD-10-CM | POA: Insufficient documentation

## 2020-09-03 DIAGNOSIS — S32019A Unspecified fracture of first lumbar vertebra, initial encounter for closed fracture: Secondary | ICD-10-CM | POA: Insufficient documentation

## 2020-09-03 DIAGNOSIS — Z79899 Other long term (current) drug therapy: Secondary | ICD-10-CM | POA: Insufficient documentation

## 2020-09-03 DIAGNOSIS — S32010A Wedge compression fracture of first lumbar vertebra, initial encounter for closed fracture: Secondary | ICD-10-CM | POA: Diagnosis not present

## 2020-09-03 DIAGNOSIS — Z881 Allergy status to other antibiotic agents status: Secondary | ICD-10-CM | POA: Diagnosis not present

## 2020-09-03 DIAGNOSIS — M4856XA Collapsed vertebra, not elsewhere classified, lumbar region, initial encounter for fracture: Secondary | ICD-10-CM | POA: Diagnosis not present

## 2020-09-03 DIAGNOSIS — Z419 Encounter for procedure for purposes other than remedying health state, unspecified: Secondary | ICD-10-CM

## 2020-09-03 DIAGNOSIS — Z888 Allergy status to other drugs, medicaments and biological substances status: Secondary | ICD-10-CM | POA: Diagnosis not present

## 2020-09-03 DIAGNOSIS — Z91040 Latex allergy status: Secondary | ICD-10-CM | POA: Insufficient documentation

## 2020-09-03 DIAGNOSIS — Z4889 Encounter for other specified surgical aftercare: Secondary | ICD-10-CM | POA: Diagnosis not present

## 2020-09-03 DIAGNOSIS — W19XXXA Unspecified fall, initial encounter: Secondary | ICD-10-CM | POA: Diagnosis not present

## 2020-09-03 DIAGNOSIS — J45909 Unspecified asthma, uncomplicated: Secondary | ICD-10-CM | POA: Diagnosis not present

## 2020-09-03 DIAGNOSIS — K219 Gastro-esophageal reflux disease without esophagitis: Secondary | ICD-10-CM | POA: Diagnosis not present

## 2020-09-03 DIAGNOSIS — Z981 Arthrodesis status: Secondary | ICD-10-CM | POA: Diagnosis not present

## 2020-09-03 DIAGNOSIS — F418 Other specified anxiety disorders: Secondary | ICD-10-CM | POA: Diagnosis not present

## 2020-09-03 HISTORY — PX: KYPHOPLASTY: SHX5884

## 2020-09-03 LAB — GLUCOSE, CAPILLARY
Glucose-Capillary: 280 mg/dL — ABNORMAL HIGH (ref 70–99)
Glucose-Capillary: 240 mg/dL — ABNORMAL HIGH (ref 70–99)
Glucose-Capillary: 209 mg/dL — ABNORMAL HIGH (ref 70–99)

## 2020-09-03 SURGERY — KYPHOPLASTY
Anesthesia: Monitor Anesthesia Care | Site: Back

## 2020-09-03 MED ORDER — 0.9 % SODIUM CHLORIDE (POUR BTL) OPTIME
TOPICAL | Status: DC | PRN
  Administered 2020-09-03: 1000 mL

## 2020-09-03 MED ORDER — CEFAZOLIN SODIUM-DEXTROSE 2-4 GM/100ML-% IV SOLN
INTRAVENOUS | Status: AC
  Filled 2020-09-03: qty 100

## 2020-09-03 MED ORDER — CHLORHEXIDINE GLUCONATE 0.12 % MT SOLN: 15.0000 mL | Freq: Once | OROMUCOSAL | Status: AC

## 2020-09-03 MED ORDER — LACTATED RINGERS IV SOLN: INTRAVENOUS | Status: DC | PRN

## 2020-09-03 MED ORDER — ONDANSETRON HCL 4 MG/2ML IJ SOLN
INTRAMUSCULAR | Status: AC
  Filled 2020-09-03: qty 2

## 2020-09-03 MED ORDER — INSULIN ASPART 100 UNIT/ML ~~LOC~~ SOLN: 6.0000 [IU] | Freq: Once | SUBCUTANEOUS | Status: AC

## 2020-09-03 MED ORDER — ORAL CARE MOUTH RINSE: 15.0000 mL | Freq: Once | OROMUCOSAL | Status: AC

## 2020-09-03 MED ORDER — PROMETHAZINE HCL 25 MG/ML IJ SOLN: 6.2500 mg | INTRAMUSCULAR | Status: DC | PRN

## 2020-09-03 MED ORDER — LIDOCAINE 2% (20 MG/ML) 5 ML SYRINGE
INTRAMUSCULAR | Status: DC | PRN
  Administered 2020-09-03: 40 mg via INTRAVENOUS

## 2020-09-03 MED ORDER — ACETAMINOPHEN 500 MG PO TABS: 1000.0000 mg | ORAL_TABLET | Freq: Once | ORAL | Status: DC

## 2020-09-03 MED ORDER — BUPIVACAINE HCL (PF) 0.25 % IJ SOLN
INTRAMUSCULAR | Status: AC
  Filled 2020-09-03: qty 30

## 2020-09-03 MED ORDER — LIDOCAINE 2% (20 MG/ML) 5 ML SYRINGE
INTRAMUSCULAR | Status: AC
  Filled 2020-09-03: qty 5

## 2020-09-03 MED ORDER — IOPAMIDOL (ISOVUE-300) INJECTION 61%
INTRAVENOUS | Status: DC | PRN
  Administered 2020-09-03: 50 mL

## 2020-09-03 MED ORDER — FENTANYL CITRATE (PF) 100 MCG/2ML IJ SOLN
INTRAMUSCULAR | Status: DC | PRN
  Administered 2020-09-03 (×2): 25 ug via INTRAVENOUS

## 2020-09-03 MED ORDER — OXYCODONE HCL 5 MG PO TABS
ORAL_TABLET | ORAL | Status: AC
  Administered 2020-09-03: 5 mg via ORAL
  Filled 2020-09-03: qty 1

## 2020-09-03 MED ORDER — MIDAZOLAM HCL 2 MG/2ML IJ SOLN
INTRAMUSCULAR | Status: AC
  Filled 2020-09-03: qty 2

## 2020-09-03 MED ORDER — OXYCODONE HCL 5 MG PO TABS: 5.0000 mg | ORAL_TABLET | Freq: Once | ORAL | Status: AC | PRN

## 2020-09-03 MED ORDER — BUPIVACAINE-EPINEPHRINE 0.25% -1:200000 IJ SOLN
INTRAMUSCULAR | Status: DC | PRN
  Administered 2020-09-03: 10 mL
  Administered 2020-09-03: 20 mL

## 2020-09-03 MED ORDER — EPINEPHRINE PF 1 MG/ML IJ SOLN
INTRAMUSCULAR | Status: AC
  Filled 2020-09-03: qty 1

## 2020-09-03 MED ORDER — FENTANYL CITRATE (PF) 100 MCG/2ML IJ SOLN
25.0000 ug | INTRAMUSCULAR | Status: DC | PRN
Start: 2020-09-03 — End: 2020-09-03

## 2020-09-03 MED ORDER — ACETAMINOPHEN 10 MG/ML IV SOLN
INTRAVENOUS | Status: AC
  Filled 2020-09-03: qty 100

## 2020-09-03 MED ORDER — DEXMEDETOMIDINE (PRECEDEX) IN NS 20 MCG/5ML (4 MCG/ML) IV SYRINGE
PREFILLED_SYRINGE | INTRAVENOUS | Status: DC | PRN
  Administered 2020-09-03: 20 ug via INTRAVENOUS

## 2020-09-03 MED ORDER — LACTATED RINGERS IV SOLN: INTRAVENOUS | Status: DC

## 2020-09-03 MED ORDER — FENTANYL CITRATE (PF) 250 MCG/5ML IJ SOLN
INTRAMUSCULAR | Status: AC
  Filled 2020-09-03: qty 5

## 2020-09-03 MED ORDER — CEFAZOLIN SODIUM-DEXTROSE 2-4 GM/100ML-% IV SOLN
2.0000 g | INTRAVENOUS | Status: AC
  Administered 2020-09-03: 2 g via INTRAVENOUS

## 2020-09-03 MED ORDER — CHLORHEXIDINE GLUCONATE 0.12 % MT SOLN
OROMUCOSAL | Status: AC
  Administered 2020-09-03: 15 mL via OROMUCOSAL
  Filled 2020-09-03: qty 15

## 2020-09-03 MED ORDER — DEXAMETHASONE SODIUM PHOSPHATE 10 MG/ML IJ SOLN
INTRAMUSCULAR | Status: AC
  Filled 2020-09-03: qty 1

## 2020-09-03 MED ORDER — PROPOFOL 500 MG/50ML IV EMUL
INTRAVENOUS | Status: DC | PRN
  Administered 2020-09-03: 75 ug/kg/min via INTRAVENOUS

## 2020-09-03 MED ORDER — OXYCODONE HCL 5 MG/5ML PO SOLN: 5.0000 mg | Freq: Once | ORAL | Status: AC | PRN

## 2020-09-03 MED ORDER — ONDANSETRON HCL 4 MG/2ML IJ SOLN
INTRAMUSCULAR | Status: DC | PRN
  Administered 2020-09-03: 4 mg via INTRAVENOUS

## 2020-09-03 MED ORDER — INSULIN ASPART 100 UNIT/ML ~~LOC~~ SOLN
SUBCUTANEOUS | Status: AC
  Administered 2020-09-03: 6 [IU] via SUBCUTANEOUS
  Filled 2020-09-03: qty 1

## 2020-09-03 MED ORDER — BUPIVACAINE LIPOSOME 1.3 % IJ SUSP
20.0000 mL | INTRAMUSCULAR | Status: AC
  Administered 2020-09-03: 20 mL
  Filled 2020-09-03: qty 20

## 2020-09-03 MED ORDER — MIDAZOLAM HCL 5 MG/5ML IJ SOLN
INTRAMUSCULAR | Status: DC | PRN
  Administered 2020-09-03: 2 mg via INTRAVENOUS

## 2020-09-03 MED ORDER — DEXMEDETOMIDINE (PRECEDEX) IN NS 20 MCG/5ML (4 MCG/ML) IV SYRINGE
PREFILLED_SYRINGE | INTRAVENOUS | Status: AC
  Filled 2020-09-03: qty 5

## 2020-09-03 SURGICAL SUPPLY — 41 items
BLADE SURG 15 STRL LF DISP TIS (BLADE) ×1 IMPLANT
BLADE SURG 15 STRL SS (BLADE) ×1
BNDG ADH 1X3 SHEER STRL LF (GAUZE/BANDAGES/DRESSINGS) ×4 IMPLANT
CEMENT KYPHON CX01A KIT/MIXER (Cement) ×2 IMPLANT
COVER MAYO STAND STRL (DRAPES) ×2 IMPLANT
COVER SURGICAL LIGHT HANDLE (MISCELLANEOUS) ×2 IMPLANT
COVER WAND RF STERILE (DRAPES) IMPLANT
CURETTE EXPRESS SZ2 7MM (INSTRUMENTS) ×1 IMPLANT
CURETTE WEDGE 8.5MM KYPHX (MISCELLANEOUS) IMPLANT
CURRETTE EXPRESS SZ2 7MM (INSTRUMENTS) ×2
DERMABOND ADHESIVE PROPEN (GAUZE/BANDAGES/DRESSINGS) ×1
DERMABOND ADVANCED (GAUZE/BANDAGES/DRESSINGS) ×1
DERMABOND ADVANCED .7 DNX12 (GAUZE/BANDAGES/DRESSINGS) ×1 IMPLANT
DERMABOND ADVANCED .7 DNX6 (GAUZE/BANDAGES/DRESSINGS) ×1 IMPLANT
DRAPE C-ARM 42X72 X-RAY (DRAPES) ×4 IMPLANT
DRAPE INCISE IOBAN 66X45 STRL (DRAPES) ×2 IMPLANT
DRAPE LAPAROTOMY T 102X78X121 (DRAPES) ×2 IMPLANT
DRAPE WARM FLUID 44X44 (DRAPES) ×2 IMPLANT
DURAPREP 26ML APPLICATOR (WOUND CARE) ×2 IMPLANT
GLOVE BIO SURGEON STRL SZ 6.5 (GLOVE) ×2 IMPLANT
GLOVE BIOGEL PI IND STRL 8.5 (GLOVE) IMPLANT
GLOVE BIOGEL PI INDICATOR 8.5 (GLOVE)
GLOVE SS BIOGEL STRL SZ 8.5 (GLOVE) ×1 IMPLANT
GLOVE SUPERSENSE BIOGEL SZ 8.5 (GLOVE) ×1
GLOVE SURG UNDER POLY LF SZ6.5 (GLOVE) ×2 IMPLANT
GOWN STRL REUS W/ TWL LRG LVL3 (GOWN DISPOSABLE) ×2 IMPLANT
GOWN STRL REUS W/TWL 2XL LVL3 (GOWN DISPOSABLE) ×2 IMPLANT
GOWN STRL REUS W/TWL LRG LVL3 (GOWN DISPOSABLE) ×2
KIT BASIN OR (CUSTOM PROCEDURE TRAY) ×2 IMPLANT
KIT TURNOVER KIT B (KITS) ×2 IMPLANT
NEEDLE HYPO 22GX1.5 SAFETY (NEEDLE) ×2 IMPLANT
NEEDLE SPNL 22GX3.5 QUINCKE BK (NEEDLE) ×2 IMPLANT
NS IRRIG 1000ML POUR BTL (IV SOLUTION) ×2 IMPLANT
PACK SURGICAL SETUP 50X90 (CUSTOM PROCEDURE TRAY) ×2 IMPLANT
PAD ARMBOARD 7.5X6 YLW CONV (MISCELLANEOUS) ×4 IMPLANT
SPONGE LAP 4X18 RFD (DISPOSABLE) ×2 IMPLANT
SUT MNCRL AB 3-0 PS2 18 (SUTURE) ×2 IMPLANT
SYR CONTROL 10ML LL (SYRINGE) ×2 IMPLANT
TOWEL GREEN STERILE (TOWEL DISPOSABLE) ×2 IMPLANT
TRAY KYPHOPAK 20/3 ONESTEP 1ST (MISCELLANEOUS) ×2 IMPLANT
WATER STERILE IRR 1000ML POUR (IV SOLUTION) ×2 IMPLANT

## 2020-09-03 NOTE — Op Note (Signed)
Operative report  Preoperative diagnosis: L1 compression fracture  Postoperative diagnosis: Same  Operative procedure: L1 kyphoplasty  Complications: None  EBL: Minimal  Indications: Holly Adams is a pleasant 50 year old woman with multiple medical issues who presents with significant low back pain.  Patient states she just bent over to pick up her dog had a fall and has had significant mid lumbar pain ever since.  Imaging studies confirmed an L1 compression fracture.  As result of the failure of conservative management and the ongoing loss in quality of life we elected to move forward with an L1 kyphoplasty.  All appropriate risks, benefits, alternatives to surgery were discussed with patient and consent was obtained.  Operative report: Patient is brought the operating room and placed prone onto the operating room table.  Once she was properly positioned and padded the back was prepped and draped in a standard fashion.  IV sedation was provided and a timeout was done.  Once we confirmed patient, procedure, and all other important data I then identified the L1 vertebral bodies with fluoroscopy in both the AP and lateral planes.  I marked out the lateral borders of the pedicle and infiltrated the skin with quarter percent Marcaine with epinephrine.  Once we had local anesthesia I then placed the spinal needle down to the lateral aspect of the facet capsule of L1 and anesthetize this area and the deep soft tissues for better intraoperative analgesia.  A small stab incision was made and the Jamshidi needle was advanced down to the lateral aspect of the facet complex.  I confirmed satisfactory positioning and advanced the Jamshidi needle into the L1 pedicle.  As I neared the medial wall of the pedicle is seen on the AP view I confirmed that I was just beyond the posterior wall of the vertebral body in the lateral.  Once I confirmed trajectory I advanced into the vertebral body.  I repeated this exact same  procedure on the contralateral side.  I then placed the drill followed by the curette in order to prepare the vertebral body.  I then sounded the canal that I created to ensure had a solid bony canal with an endplate.  Once this was confirmed I inserted the inflatable bone tamps, and then inflated them.  Once I had adequate inflation of the inflatable balloons I then deflated them and inserted the cement.  A total of approximately 2 cc on either side for a total of 4 was used.  X-rays demonstrate satisfactory fill of the vertebral body across the midline and along the inferior endplate.  There was no leak of cement noted.  The cement was allowed to harden and the Jamshidi needles were removed skin edges were cleaned and the wounds were closed with 3-0 Monocryl simple stitch.  Dermabond and a Band-Aid were applied final x-rays were satisfactory.  Cement mantle was in good position in the L1 vertebral body.  Patient was transferred to the PACU without incident.  The end of the case all needle sponge counts were correct.

## 2020-09-03 NOTE — Anesthesia Postprocedure Evaluation (Signed)
Anesthesia Post Note  Patient: Holly Adams  Procedure(s) Performed: L1 KYPHOPLASTY (N/A Back)     Patient location during evaluation: PACU Anesthesia Type: MAC Level of consciousness: awake and alert and oriented Pain management: pain level controlled Vital Signs Assessment: post-procedure vital signs reviewed and stable Respiratory status: spontaneous breathing, nonlabored ventilation and respiratory function stable Cardiovascular status: blood pressure returned to baseline Postop Assessment: no apparent nausea or vomiting Anesthetic complications: no   No complications documented.  Last Vitals:  Vitals:   09/03/20 1150 09/03/20 1205  BP: (!) 89/69 90/68  Pulse: 85 84  Resp: 17 17  Temp:    SpO2: 94% 94%    Last Pain:  Vitals:   09/03/20 1205  TempSrc:   PainSc: [place]

## 2020-09-03 NOTE — Anesthesia Preprocedure Evaluation (Addendum)
Anesthesia Evaluation  Patient identified by MRN, date of birth, ID band  Reviewed: Allergy & Precautions, NPO status , Patient's Chart, lab work & pertinent test results  History of Anesthesia Complications Negative for: history of anesthetic complications  Airway Mallampati: II  TM Distance: >3 FB Neck ROM: Full    Dental  (+) Edentulous Lower, Edentulous Upper, Dental Advisory Given   Pulmonary asthma , Current Smoker and Patient abstained from smoking.,    Pulmonary exam normal        Cardiovascular + Past MI  Normal cardiovascular exam     Neuro/Psych  Headaches, Anxiety Depression TIA   GI/Hepatic Neg liver ROS, GERD  Controlled,  Endo/Other  diabetes, Poorly Controlled, Type 2, Insulin Dependent  Renal/GU negative Renal ROS  negative genitourinary   Musculoskeletal  (+) Arthritis ,   Abdominal   Peds  Hematology negative hematology ROS (+)   Anesthesia Other Findings Day of surgery medications reviewed with patient.  Reproductive/Obstetrics negative OB ROS                           Anesthesia Physical Anesthesia Plan  ASA: III  Anesthesia Plan: MAC   Post-op Pain Management:    Induction:   PONV Risk Score and Plan: 2 and Treatment may vary due to age or medical condition, Propofol infusion, Ondansetron and Midazolam  Airway Management Planned: Natural Airway and Nasal Cannula  Additional Equipment: None  Intra-op Plan:   Post-operative Plan:   Informed Consent: I have reviewed the patients History and Physical, chart, labs and discussed the procedure including the risks, benefits and alternatives for the proposed anesthesia with the patient or authorized representative who has indicated his/her understanding and acceptance.       Plan Discussed with: CRNA  Anesthesia Plan Comments:         Anesthesia Quick Evaluation

## 2020-09-03 NOTE — Anesthesia Procedure Notes (Signed)
Procedure Name: MAC Date/Time: 09/03/2020 10:25 AM Performed by: Jenne Campus, CRNA Pre-anesthesia Checklist: Patient identified, Emergency Drugs available, Suction available and Patient being monitored

## 2020-09-03 NOTE — H&P (Signed)
Chief Complaint: back pain due to L1 compression fracture  History: Holly Adams is a very pleasant 50 year old woman with acute onset of severe low back pain.  Imaging studies demonstrate an L1 compression fracture.  Attempted conservative management had failed to alleviate her symptoms.  Because of the ongoing severity of her symptoms we elected to move forward with an L1 kyphoplasty.  All appropriate risks, benefits, alternatives were discussed with the patient and consent was obtained.  Past Medical History:  Diagnosis Date  . Abnormal brain MRI   . Ambulates with cane    straight   . Angina pectoris ([place]) 06/19/2020  . Arthritis    back-lower  . Asthma    as a child, no problems as an adult, no inhaler   . Bilateral lower extremity edema   . Breast cancer ([place])    left lumpectomy  . Cardiac murmur 06/19/2020  . Celiac disease 04/01/2020   patient denies this dx on 08/12/20  . Chronic migraine 11/14/2018  . Class 1 obesity with body mass index (BMI) of 33.0 to 33.9 in adult   . Confusion 11/14/2018  . Depression 04/01/2020  . Depression with anxiety   . Dizziness   . Eschar of wound bed 06/05/2020  . Frequent falls    ambultaes with cane  . Generalized anxiety disorder with panic attacks   . GERD (gastroesophageal reflux disease)   . Heart murmur    patient denies this dx - was heard by Dr Geraldo Pitter 06/19/20  . History of asthma   . History of kidney stones    Reports three kidney stones 19+ years ago while she was on calcium supplements., passed stones  . History of seizures   . History of shingles   . Hypertriglyceridemia 09/19/2019  . Hypotension, postural   . Irritable bowel    patient denies this dx as of 08/13/20  . Memory loss   . Migraines   . Myocardial infarction [facility])    patient denies this dx as of 08/13/20  . 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 Pneumonia    x 1 as a child  . PONV (postoperative nausea and vomiting)    patient states "no PONV with anesthesia"  .  Seizure-like activity ([place])   . TIA (transient ischemic attack)   . Type 2 diabetes mellitus with diabetic polyneuropathy, with long-term current use of insulin ([place]) 09/19/2019   feet    Allergies  Allergen Reactions  . Azithromycin Rash and Shortness Of Breath  . Latex Shortness Of Breath  . Amitriptyline Other (See Comments)    "Caused her to roll repeatedly (fell out of the bed)."  . Gabapentin Other (See Comments)    Pt reports lowers BP and causes dizziness  . Hydrogen Peroxide Other (See Comments)    RASH  . Lyrica [Pregabalin] Other (See Comments)    Sleep walking, night terrors  . Other     Hydrogen peroxide--raw shin     No current facility-administered medications on file prior to encounter.   Current Outpatient Medications on File Prior to Encounter  Medication Sig Dispense Refill  . acetaminophen (TYLENOL) 500 MG tablet Take 500 mg by mouth every 4 (four) hours as needed (pain.).    Marland Kitchen DULoxetine (CYMBALTA) 60 MG capsule TAKE ONE (1) CAPSULE BY MOUTH TWICE (2)  DAILY (Patient taking differently: Take 60 mg by mouth in the morning and at bedtime. TAKE ONE (1) CAPSULE BY MOUTH TWICE (2)  DAILY) 180 capsule 0  .  fenofibrate (TRICOR) 145 MG tablet Take 145 mg by mouth at bedtime.    Marland Kitchen HYDROcodone-acetaminophen (NORCO) 10-325 MG tablet Take 1 tablet by mouth every 8 (eight) hours as needed (pain.).    Marland Kitchen ibuprofen (ADVIL) 800 MG tablet Take 800 mg by mouth in the morning and at bedtime.    . Ibuprofen-Famotidine 800-26.6 MG TABS Take 1 tablet by mouth 2 (two) times daily as needed (pain/neuropathy).    . insulin glargine (LANTUS) 100 UNIT/ML injection Inject 0.5 mLs (50 Units total) into the skin at bedtime. 10 mL 6  . insulin regular (NOVOLIN R) 100 units/mL injection Inject 0-24 Units into the skin 4 (four) times daily -  before meals and at bedtime. Sliding Scale Insulin    . lamoTRIgine (LAMICTAL) 100 MG tablet Take 1 tablet (100 mg total) by mouth 2 (two) times daily.  (Patient taking differently: Take 100 mg by mouth 2 (two) times daily. Breakfast & bedtime) 180 tablet 0  . linagliptin (TRADJENTA) 5 MG TABS tablet Take 5 mg by mouth daily.    . Omega-3 Fatty Acids (FISH OIL) 1000 MG CPDR Take 1,000 mg by mouth in the morning and at bedtime. Breakfast & bedtime    . omeprazole (PRILOSEC) 20 MG capsule Take 20 mg by mouth daily with breakfast.    . PREMARIN 0.625 MG tablet Take 0.625 mg by mouth daily.    . Probiotic Product (PROBIOTIC PO) Take 1 capsule by mouth in the morning and at bedtime.    . rosuvastatin (CRESTOR) 20 MG tablet Take 20 mg by mouth daily with lunch.    . spironolactone (ALDACTONE) 25 MG tablet Take 25 mg by mouth daily with breakfast.    . SUMAtriptan (IMITREX) 100 MG tablet Take 1 tab at onset of migraine.  May repeat in 2 hrs, if needed.  Max dose: 2 tabs/day or 9/month. (Patient taking differently: Take 100 mg by mouth as directed. Take 1 tab at onset of migraine.  May repeat in 2 hrs, if needed.  Max dose: 2 tabs/day or 9/month.) 27 tablet 1  . Vancomycin HCl (VANCOCIN PO) Take 125 mg by mouth 4 (four) times daily. ~6 days remaining on course    . venlafaxine XR (EFFEXOR-XR) 37.5 MG 24 hr capsule Take 37.5 mg by mouth daily with lunch.    . icosapent Ethyl (VASCEPA) 1 g capsule Take 2 capsules (2 g total) by mouth 2 (two) times daily. (Patient not taking: No sig reported) 120 capsule 12  . midodrine (PROAMATINE) 2.5 MG tablet Take 2.5 mg by mouth 3 (three) times daily as needed.    . midodrine (PROAMATINE) 5 MG tablet Take 5 mg by mouth in the morning and at bedtime.      Physical Exam: Vitals:   09/03/20 0844  BP: (!) 121/95  Pulse: (!) 109  Resp: 18  Temp: 97.8 F (36.6 C)  SpO2: 99%   Body mass index is 32.88 kg/m. General: AAOX3, well developed and well nourished, Tearful, unable to find a comfortable position Ambulation: antalgic gait pattern, uses cane assistive device. Heart: Regular rate and rhythm, no rubs, murmurs,  or gallops Lungs: Clear auscultation bilaterally Abdomen: Bowel sounds 4, nondistended, nontender, no rebound tenderness Inspection: No obvious deformity Palpation: Extremely tender to palpation at approximately the L1 level. AROM: - Significant pain with range of motion of lumbar spine - Knee: flexion and extension normal and pain free bilaterally. - Ankle: Dorsiflexion, plantarflexion, inversion, eversion normal and pain free.  Dermatomes: Lower extremity  sensation to light touch intact bilaterally Myotomes:  - Difficult to assess lower extremity motor function due to patient's significantly pain. No obvious lower extremity motor deficit  Reflexes:  - Babinski: Left Ngative, Right Negative - Clonus: Negative Special Tests: - Straight Leg Raise: Left Negative, Right Negative  PV: Extremities warm and well profused. Posterior and dorsalis pedis pulse 2+ bilaterally, No pitting Edema, discoloration, calf tenderness  X-Ray impression: AP, lateral, spot lumbar films were taken at today's office visit and images were personally reviewed by me. There is a time of the terminal L1 compression deformity, this is not appear to be further collapse compared to the MRI taken a few days prior. No other fractures seen. Normal alignment, no significant scoliosis, no significant slip. Disc space height generally well maintained without any significant collapse.  MRI Impression: MRI of lumbar spine performed on 08/01/2020. Both images as well as the report were personally reviewed by me. There is an acute L1 compression fracture with 25% height loss and no significant retropulsion. Mild lumbar spondylosis with mild spinal canal and right neural foraminal stenosis at L4-5. Conus extends to the L2 level conus and cauda equina appear normal.  Image: DG Chest 2 View  Result Date: 09/02/2020 CLINICAL DATA:  Patient for L1 kyphoplasty tomorrow. Preoperative exam. EXAM: CHEST - 2 VIEW COMPARISON:  None.  FINDINGS: Lungs clear. Heart size normal. No pneumothorax or pleural fluid. L1 compression fracture noted. IMPRESSION: No acute cardiopulmonary disease. L1 compression fracture. Electronically Signed   By: Inge Rise M.D.   On: 09/02/2020 16:45    A/P:  Holly Adams is a pleasant 50 year old female with past medical history significant for diabetes (last A1c was 10) and chronic low back pain who presents today with worsening back pain due to a L1 compression fracture. She states on Christmas day she bent down and was holding her dog's front feet when she felt a pop in her back. Her chief complaint is significant pain especially with range of motion just below her bra strap area. She is tender to palpation at approximately L1. Fortunately she does not have any radicular symptoms. Imaging studies do reveal an acute L1 compression deformity. Given that her pain is severe even despite narcotic pain medication, I do think moving forward with the kyphoplasty procedure is warranted.  Plan: Risks of surgery include: Infection, bleeding, death, stroke, paralysis, nerve damage, leak of cement, need for additional surgery including open decompression. Ongoing or worse pain.    Goals of surgery: Reduction in pain, and improvement in quality of life

## 2020-09-03 NOTE — Transfer of Care (Signed)
Immediate Anesthesia Transfer of Care Note  Patient: Holly Adams  Procedure(s) Performed: L1 KYPHOPLASTY (N/A Back)  Patient Location: PACU  Anesthesia Type:MAC  Level of Consciousness: awake, oriented and patient cooperative  Airway & Oxygen Therapy: Patient Spontanous Breathing  Post-op Assessment: Report given to RN and Post -op Vital signs reviewed and stable  Post vital signs: Reviewed  Last Vitals:  Vitals Value Taken Time  BP    Temp    Pulse    Resp    SpO2      Last Pain:  Vitals:   09/03/20 0912  TempSrc:   PainSc: 5       Patients Stated Pain Goal: 2 (48/35/07 5732)  Complications: No complications documented.

## 2020-09-04 ENCOUNTER — Encounter (HOSPITAL_COMMUNITY): Payer: Self-pay | Admitting: Orthopedic Surgery

## 2020-09-19 DIAGNOSIS — M5459 Other low back pain: Secondary | ICD-10-CM | POA: Diagnosis not present

## 2020-09-23 ENCOUNTER — Ambulatory Visit (INDEPENDENT_AMBULATORY_CARE_PROVIDER_SITE_OTHER): Payer: BC Managed Care – PPO

## 2020-09-23 ENCOUNTER — Other Ambulatory Visit: Payer: Self-pay

## 2020-09-23 DIAGNOSIS — R011 Cardiac murmur, unspecified: Secondary | ICD-10-CM

## 2020-09-23 LAB — ECHOCARDIOGRAM COMPLETE
Area-P 1/2: 3.3 cm2
S' Lateral: 2.7 cm

## 2020-09-23 NOTE — Progress Notes (Signed)
Complete echocardiogram performed.  Jimmy Sophya Vanblarcom RDCS, RVT  

## 2020-09-25 DIAGNOSIS — E1121 Type 2 diabetes mellitus with diabetic nephropathy: Secondary | ICD-10-CM | POA: Diagnosis not present

## 2020-09-25 DIAGNOSIS — N3000 Acute cystitis without hematuria: Secondary | ICD-10-CM | POA: Diagnosis not present

## 2020-09-25 DIAGNOSIS — K589 Irritable bowel syndrome without diarrhea: Secondary | ICD-10-CM | POA: Diagnosis not present

## 2020-09-25 DIAGNOSIS — A498 Other bacterial infections of unspecified site: Secondary | ICD-10-CM | POA: Diagnosis not present

## 2020-10-01 DIAGNOSIS — K219 Gastro-esophageal reflux disease without esophagitis: Secondary | ICD-10-CM | POA: Diagnosis not present

## 2020-10-03 DIAGNOSIS — Z4889 Encounter for other specified surgical aftercare: Secondary | ICD-10-CM | POA: Diagnosis not present

## 2020-10-14 ENCOUNTER — Other Ambulatory Visit: Payer: Self-pay

## 2020-10-15 ENCOUNTER — Ambulatory Visit (INDEPENDENT_AMBULATORY_CARE_PROVIDER_SITE_OTHER): Payer: BC Managed Care – PPO | Admitting: Cardiology

## 2020-10-15 ENCOUNTER — Encounter: Payer: Self-pay | Admitting: Cardiology

## 2020-10-15 ENCOUNTER — Other Ambulatory Visit: Payer: Self-pay

## 2020-10-15 VITALS — BP 112/80 | HR 104 | Ht 63.0 in | Wt 201.4 lb

## 2020-10-15 DIAGNOSIS — E1140 Type 2 diabetes mellitus with diabetic neuropathy, unspecified: Secondary | ICD-10-CM | POA: Diagnosis not present

## 2020-10-15 DIAGNOSIS — E1142 Type 2 diabetes mellitus with diabetic polyneuropathy: Secondary | ICD-10-CM | POA: Diagnosis not present

## 2020-10-15 DIAGNOSIS — Z794 Long term (current) use of insulin: Secondary | ICD-10-CM | POA: Diagnosis not present

## 2020-10-15 DIAGNOSIS — I951 Orthostatic hypotension: Secondary | ICD-10-CM

## 2020-10-15 DIAGNOSIS — E781 Pure hyperglyceridemia: Secondary | ICD-10-CM

## 2020-10-15 DIAGNOSIS — L84 Corns and callosities: Secondary | ICD-10-CM | POA: Diagnosis not present

## 2020-10-15 NOTE — Progress Notes (Signed)
Cardiology Office Note:    Date:  10/15/2020   ID:  Holly Adams, DOB [birth year], MRN [id number]  PCP:  Leilani Able, FNP  Cardiologist:  Jenean Lindau, MD   Referring MD: Farrel Conners*    ASSESSMENT:    1. Hypotension, postural   2. Hypertriglyceridemia   3. Type 2 diabetes mellitus with diabetic polyneuropathy, with long-term current use of insulin (HCC)    PLAN:    In order of problems listed above:  1. Primary prevention stressed with the patient.  Importance of compliance with diet medication stressed and she vocalized understanding.  I told her that if she has issues with dizziness once her pain is better she can increase some salt intake in the diet or increase midodrine dose.  She will get in touch with me if these issues recur. 2. Orthostatic issues: As mentioned above.  Lifestyle modification urged. 3. Diabetes mellitus and mixed dyslipidemia: I discussed my findings with the patient at length.  She tells me that her numbers are looking better.  Diet was emphasized. 4. Obesity: I discussed my findings with the patient at length.  Weight reduction was stressed.  Risks of obesity emphasized and she promises to do better. 5. Stress test report was discussed with the patient and questions were answered to her satisfaction. 6. Patient will be seen in follow-up appointment in 6 months or earlier if the patient has any concerns    Medication Adjustments/Labs and Tests Ordered: Current medicines are reviewed at length with the patient today.  Concerns regarding medicines are outlined above.  No orders of the defined types were placed in this encounter.  No orders of the defined types were placed in this encounter.    No chief complaint on file.    History of Present Illness:    Holly Adams is a 50 y.o. female.  Patient has past medical history of mixed dyslipidemia and diabetes mellitus.  She has had history of postural hypotension and has been on  midodrine for this.  However she had a fracture of ORT.Marland Kitchen  And her pain has brought her blood pressure up.  Now she mentions to me that her blood pressure is fine and she has no postural hypotension issues because of her pain.  She has been attended to by specialist.  Past Medical History:  Diagnosis Date  . Abnormal brain MRI   . Ambulates with cane    straight   . Angina pectoris ([place]) 06/19/2020  . Arthritis    back-lower  . Asthma    as a child, no problems as an adult, no inhaler   . Bilateral lower extremity edema   . Breast cancer ([place])    left lumpectomy  . Cardiac murmur 06/19/2020  . Celiac disease 04/01/2020   patient denies this dx on 08/12/20  . Chronic migraine 11/14/2018  . Class 1 obesity with body mass index (BMI) of 33.0 to 33.9 in adult   . Confusion 11/14/2018  . Depression 04/01/2020  . Depression with anxiety   . Dizziness   . Eschar of wound bed 06/05/2020  . Frequent falls    ambultaes with cane  . Generalized anxiety disorder with panic attacks   . GERD (gastroesophageal reflux disease)   . Heart murmur    patient denies this dx - was heard by Dr Geraldo Pitter 06/19/20  . History of asthma   . History of kidney stones    Reports three kidney stones 19+ years ago while  she was on calcium supplements., passed stones  . History of seizures   . History of shingles   . Hypertriglyceridemia 09/19/2019  . Hypotension, postural   . Irritable bowel    patient denies this dx as of 08/13/20  . Memory loss   . Migraines   . Myocardial infarction [facility])    patient denies this dx as of 08/13/20  . 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 Pneumonia    x 1 as a child  . PONV (postoperative nausea and vomiting)   . Seizure-like activity ([place]) 11/15/2018  . TIA (transient ischemic attack)   . Type 2 diabetes mellitus with diabetic polyneuropathy, with long-term current use of insulin ([place]) 09/19/2019   feet    Past Surgical History:  Procedure Laterality Date  . ABDOMINAL  HYSTERECTOMY  2004  . ANKLE ARTHROPLASTY Left 2004   fx ankle-rods and pins-lt  . APPENDECTOMY  1992  . BREAST LUMPECTOMY  2017   left  . CERVICAL CONE BIOPSY    . CHOLECYSTECTOMY  2003  . CYST REMOVAL HAND Right   . DIAGNOSTIC LAPAROSCOPY     tubal preg  . DORSAL COMPARTMENT RELEASE  07/06/2011   Procedure: RELEASE DORSAL COMPARTMENT (DE[place]ERVAIN);  Surgeon: Hessie Dibble, MD;  Location: [facility];  Service: Orthopedics;  Laterality: Right;  and FCR debridement  . KNEE ARTHROSCOPY  1992   lt   . KYPHOPLASTY N/A 09/03/2020   Procedure: L1 KYPHOPLASTY;  Surgeon: Melina Schools, MD;  Location: [place];  Service: Orthopedics;  Laterality: N/A;  60 mins Local anesthesia  . MULTIPLE TOOTH EXTRACTIONS     full dentures  . MULTIPLE TOOTH EXTRACTIONS    . PLANTAR'S WART EXCISION     patient denies this procedure   . TUBAL LIGATION      Current Medications: Current Meds  Medication Sig  . acetaminophen (TYLENOL) 500 MG tablet Take 500 mg by mouth every 4 (four) hours as needed (pain.).  Marland Kitchen dicyclomine (BENTYL) 10 MG capsule Take 10 mg by mouth every 6 (six) hours as needed for pain.  . DULoxetine (CYMBALTA) 60 MG capsule Take 60 mg by mouth 2 (two) times daily.  . fenofibrate (TRICOR) 145 MG tablet Take 145 mg by mouth at bedtime.  . insulin glargine (LANTUS) 100 UNIT/ML injection Inject 20 Units into the skin at bedtime.  . insulin regular (NOVOLIN R) 100 units/mL injection Inject 0-24 Units into the skin 4 (four) times daily -  before meals and at bedtime. Sliding Scale Insulin  . lamoTRIgine (LAMICTAL) 100 MG tablet Take 1 tablet (100 mg total) by mouth 2 (two) times daily.  Marland Kitchen linagliptin (TRADJENTA) 5 MG TABS tablet Take 5 mg by mouth daily.  . midodrine (PROAMATINE) 5 MG tablet Take 5 mg by mouth as needed (increase b/p).  . Omega-3 Fatty Acids (FISH OIL) 1000 MG CPDR Take 1,000 mg by mouth in the morning and at bedtime. Breakfast & bedtime  . omeprazole (PRILOSEC) 20  MG capsule Take 20 mg by mouth daily with breakfast.  . ondansetron (ZOFRAN-ODT) 8 MG disintegrating tablet Take 8 mg by mouth 3 (three) times daily.  Marland Kitchen PREMARIN 0.625 MG tablet Take 0.625 mg by mouth daily.  . Probiotic Product (PROBIOTIC PO) Take 1 capsule by mouth in the morning and at bedtime.  . promethazine (PHENERGAN) 12.5 MG tablet Take 12.5 mg by mouth as needed for nausea/vomiting.  . rosuvastatin (CRESTOR) 20 MG tablet Take 20 mg by mouth daily with lunch.  Marland Kitchen  spironolactone (ALDACTONE) 25 MG tablet Take 25 mg by mouth daily with breakfast.  . SUMAtriptan (IMITREX) 100 MG tablet Take 100 mg by mouth as needed for migraine. May repeat in 2 hours if headache persists or recurs.  Marland Kitchen VASCEPA 1 g capsule Take 2 g by mouth 2 (two) times daily.  Marland Kitchen venlafaxine XR (EFFEXOR-XR) 37.5 MG 24 hr capsule Take 37.5 mg by mouth daily with lunch.     Allergies:   Azithromycin, Latex, Amitriptyline, Gabapentin, Hydrogen peroxide, Lyrica [pregabalin], and Other   Social History   Socioeconomic History  . Marital status: Married    Spouse name: Not on file  . Number of children: 1  . Years of education: college  . Highest education level: Associate degree: academic program  Occupational History  . Occupation: Disabled  Tobacco Use  . Smoking status: Current Every Day Smoker    Packs/day: 1.00    Years: 28.00    Pack years: 28.00    Types: Cigarettes  . Smokeless tobacco: Never Used  Vaping Use  . Vaping Use: Never used  Substance and Sexual Activity  . Alcohol use: Not Currently  . Drug use: No  . Sexual activity: Not on file    Comment: Hysterectomy  Other Topics Concern  . Not on file  Social History Narrative   2 cups caffeine per day.   Right-handed.   Lives at home with her significant other.   Social Determinants of Health   Financial Resource Strain: Not on file  Food Insecurity: Not on file  Transportation Needs: Not on file  Physical Activity: Not on file  Stress: Not  on file  Social Connections: Not on file     Family History: The patient's family history includes Alcoholism in her mother; Depression in her mother; Heart disease in her father; Hypertension in her father; Ovarian cancer in her mother; Yves Dill Parkinson White syndrome in her father.  ROS:   Please see the history of present illness.    All other systems reviewed and are negative.  EKGs/Labs/Other Studies Reviewed:    The following studies were reviewed today: IMPRESSIONS    1. Left ventricular ejection fraction, by estimation, is 60 to 65%. The  left ventricle has normal function. The left ventricle has no regional  wall motion abnormalities. Left ventricular diastolic parameters are  consistent with Grade I diastolic  dysfunction (impaired relaxation).    Recent Labs: 09/02/2020: BUN 8; Creatinine, Ser 0.84; Hemoglobin 17.5; Platelets 238; Potassium 3.3; Sodium 131  Recent Lipid Panel No results found for: CHOL, TRIG, HDL, CHOLHDL, VLDL, LDLCALC, LDLDIRECT  Physical Exam:    VS:  BP 112/80   Pulse (!) 104   Ht 5\' 3"  (1.6 m)   Wt 201 lb 6.4 oz (91.4 kg)   LMP  (LMP Unknown)   SpO2 97%   BMI 35.68 kg/m     Wt Readings from Last 3 Encounters:  10/15/20 201 lb 6.4 oz (91.4 kg)  09/03/20 197 lb 9.6 oz (89.6 kg)  06/19/20 202 lb 12.8 oz (92 kg)     GEN: Patient is in no acute distress HEENT: Normal NECK: No JVD; No carotid bruits LYMPHATICS: No lymphadenopathy CARDIAC: Hear sounds regular, 2/6 systolic murmur at the apex. RESPIRATORY:  Clear to auscultation without rales, wheezing or rhonchi  ABDOMEN: Soft, non-tender, non-distended MUSCULOSKELETAL:  No edema; No deformity  SKIN: Warm and dry NEUROLOGIC:  Alert and oriented x 3 PSYCHIATRIC:  Normal affect   Signed, Jenean Lindau, MD  10/15/2020 1:57 PM    [facility]

## 2020-10-15 NOTE — Patient Instructions (Signed)

## 2020-10-17 DIAGNOSIS — Z20822 Contact with and (suspected) exposure to covid-19: Secondary | ICD-10-CM | POA: Diagnosis not present

## 2020-10-17 DIAGNOSIS — Z20828 Contact with and (suspected) exposure to other viral communicable diseases: Secondary | ICD-10-CM | POA: Diagnosis not present

## 2020-10-21 DIAGNOSIS — K449 Diaphragmatic hernia without obstruction or gangrene: Secondary | ICD-10-CM | POA: Diagnosis not present

## 2020-10-21 DIAGNOSIS — K644 Residual hemorrhoidal skin tags: Secondary | ICD-10-CM | POA: Diagnosis not present

## 2020-10-21 DIAGNOSIS — F329 Major depressive disorder, single episode, unspecified: Secondary | ICD-10-CM | POA: Diagnosis not present

## 2020-10-21 DIAGNOSIS — R634 Abnormal weight loss: Secondary | ICD-10-CM | POA: Diagnosis not present

## 2020-10-21 DIAGNOSIS — Z8619 Personal history of other infectious and parasitic diseases: Secondary | ICD-10-CM | POA: Diagnosis not present

## 2020-10-21 DIAGNOSIS — J45909 Unspecified asthma, uncomplicated: Secondary | ICD-10-CM | POA: Diagnosis not present

## 2020-10-21 DIAGNOSIS — R1310 Dysphagia, unspecified: Secondary | ICD-10-CM | POA: Diagnosis not present

## 2020-10-21 DIAGNOSIS — I959 Hypotension, unspecified: Secondary | ICD-10-CM | POA: Diagnosis not present

## 2020-10-21 DIAGNOSIS — E119 Type 2 diabetes mellitus without complications: Secondary | ICD-10-CM | POA: Diagnosis not present

## 2020-10-21 DIAGNOSIS — K229 Disease of esophagus, unspecified: Secondary | ICD-10-CM | POA: Diagnosis not present

## 2020-10-21 DIAGNOSIS — Z794 Long term (current) use of insulin: Secondary | ICD-10-CM | POA: Diagnosis not present

## 2020-10-21 DIAGNOSIS — K222 Esophageal obstruction: Secondary | ICD-10-CM | POA: Diagnosis not present

## 2020-10-21 DIAGNOSIS — E039 Hypothyroidism, unspecified: Secondary | ICD-10-CM | POA: Diagnosis not present

## 2020-10-21 DIAGNOSIS — K219 Gastro-esophageal reflux disease without esophagitis: Secondary | ICD-10-CM | POA: Diagnosis not present

## 2020-10-21 DIAGNOSIS — K591 Functional diarrhea: Secondary | ICD-10-CM | POA: Diagnosis not present

## 2020-10-23 DIAGNOSIS — M5136 Other intervertebral disc degeneration, lumbar region: Secondary | ICD-10-CM | POA: Diagnosis not present

## 2020-11-19 DIAGNOSIS — E1140 Type 2 diabetes mellitus with diabetic neuropathy, unspecified: Secondary | ICD-10-CM | POA: Diagnosis not present

## 2020-11-19 DIAGNOSIS — M722 Plantar fascial fibromatosis: Secondary | ICD-10-CM | POA: Diagnosis not present

## 2020-11-24 DIAGNOSIS — R634 Abnormal weight loss: Secondary | ICD-10-CM | POA: Diagnosis not present

## 2020-11-27 DIAGNOSIS — G8929 Other chronic pain: Secondary | ICD-10-CM | POA: Diagnosis not present

## 2020-11-27 DIAGNOSIS — R109 Unspecified abdominal pain: Secondary | ICD-10-CM | POA: Diagnosis not present

## 2020-11-27 DIAGNOSIS — K3184 Gastroparesis: Secondary | ICD-10-CM | POA: Diagnosis not present

## 2020-11-27 DIAGNOSIS — R112 Nausea with vomiting, unspecified: Secondary | ICD-10-CM | POA: Diagnosis not present

## 2020-11-27 DIAGNOSIS — K30 Functional dyspepsia: Secondary | ICD-10-CM | POA: Diagnosis not present

## 2020-12-24 DIAGNOSIS — D582 Other hemoglobinopathies: Secondary | ICD-10-CM | POA: Diagnosis not present

## 2020-12-24 DIAGNOSIS — K589 Irritable bowel syndrome without diarrhea: Secondary | ICD-10-CM | POA: Diagnosis not present

## 2020-12-24 DIAGNOSIS — R197 Diarrhea, unspecified: Secondary | ICD-10-CM | POA: Diagnosis not present

## 2020-12-24 DIAGNOSIS — E1121 Type 2 diabetes mellitus with diabetic nephropathy: Secondary | ICD-10-CM | POA: Diagnosis not present

## 2020-12-24 DIAGNOSIS — R21 Rash and other nonspecific skin eruption: Secondary | ICD-10-CM | POA: Diagnosis not present

## 2020-12-24 DIAGNOSIS — N3001 Acute cystitis with hematuria: Secondary | ICD-10-CM | POA: Diagnosis not present

## 2020-12-26 DIAGNOSIS — M5459 Other low back pain: Secondary | ICD-10-CM | POA: Diagnosis not present

## 2020-12-26 DIAGNOSIS — Z4889 Encounter for other specified surgical aftercare: Secondary | ICD-10-CM | POA: Diagnosis not present

## 2021-01-01 DIAGNOSIS — E119 Type 2 diabetes mellitus without complications: Secondary | ICD-10-CM | POA: Diagnosis not present

## 2021-01-22 DIAGNOSIS — E1121 Type 2 diabetes mellitus with diabetic nephropathy: Secondary | ICD-10-CM | POA: Diagnosis not present

## 2021-01-22 DIAGNOSIS — M79645 Pain in left finger(s): Secondary | ICD-10-CM | POA: Diagnosis not present

## 2021-02-05 DIAGNOSIS — M545 Low back pain, unspecified: Secondary | ICD-10-CM | POA: Diagnosis not present

## 2021-02-05 DIAGNOSIS — R21 Rash and other nonspecific skin eruption: Secondary | ICD-10-CM | POA: Diagnosis not present

## 2021-02-05 DIAGNOSIS — R768 Other specified abnormal immunological findings in serum: Secondary | ICD-10-CM | POA: Diagnosis not present

## 2021-02-05 DIAGNOSIS — M797 Fibromyalgia: Secondary | ICD-10-CM | POA: Diagnosis not present

## 2021-03-04 DIAGNOSIS — Z6833 Body mass index (BMI) 33.0-33.9, adult: Secondary | ICD-10-CM | POA: Diagnosis not present

## 2021-03-04 DIAGNOSIS — R109 Unspecified abdominal pain: Secondary | ICD-10-CM | POA: Diagnosis not present

## 2021-03-16 DIAGNOSIS — R768 Other specified abnormal immunological findings in serum: Secondary | ICD-10-CM | POA: Diagnosis not present

## 2021-03-16 DIAGNOSIS — M545 Low back pain, unspecified: Secondary | ICD-10-CM | POA: Diagnosis not present

## 2021-03-16 DIAGNOSIS — M797 Fibromyalgia: Secondary | ICD-10-CM | POA: Diagnosis not present

## 2021-03-16 DIAGNOSIS — R21 Rash and other nonspecific skin eruption: Secondary | ICD-10-CM | POA: Diagnosis not present

## 2021-04-13 DIAGNOSIS — R197 Diarrhea, unspecified: Secondary | ICD-10-CM | POA: Diagnosis not present

## 2021-04-13 DIAGNOSIS — K3184 Gastroparesis: Secondary | ICD-10-CM | POA: Diagnosis not present

## 2021-04-16 ENCOUNTER — Other Ambulatory Visit: Payer: Self-pay

## 2021-04-23 ENCOUNTER — Ambulatory Visit: Payer: BLUE CROSS/BLUE SHIELD | Admitting: Cardiology

## 2021-05-25 DIAGNOSIS — E1121 Type 2 diabetes mellitus with diabetic nephropathy: Secondary | ICD-10-CM | POA: Diagnosis not present

## 2021-05-25 DIAGNOSIS — Z4889 Encounter for other specified surgical aftercare: Secondary | ICD-10-CM | POA: Diagnosis not present

## 2021-05-25 DIAGNOSIS — Z13820 Encounter for screening for osteoporosis: Secondary | ICD-10-CM | POA: Diagnosis not present

## 2021-05-25 DIAGNOSIS — A498 Other bacterial infections of unspecified site: Secondary | ICD-10-CM | POA: Diagnosis not present

## 2021-05-25 DIAGNOSIS — R197 Diarrhea, unspecified: Secondary | ICD-10-CM | POA: Diagnosis not present

## 2021-05-25 DIAGNOSIS — Z23 Encounter for immunization: Secondary | ICD-10-CM | POA: Diagnosis not present

## 2021-05-25 DIAGNOSIS — L732 Hidradenitis suppurativa: Secondary | ICD-10-CM | POA: Diagnosis not present

## 2021-05-25 DIAGNOSIS — M5459 Other low back pain: Secondary | ICD-10-CM | POA: Diagnosis not present

## 2021-06-01 ENCOUNTER — Other Ambulatory Visit: Payer: Self-pay | Admitting: Cardiology

## 2021-06-01 NOTE — Telephone Encounter (Signed)
RX sent

## 2021-06-06 DIAGNOSIS — M5451 Vertebrogenic low back pain: Secondary | ICD-10-CM | POA: Diagnosis not present

## 2021-06-08 DIAGNOSIS — R197 Diarrhea, unspecified: Secondary | ICD-10-CM | POA: Diagnosis not present

## 2021-06-15 DIAGNOSIS — M5451 Vertebrogenic low back pain: Secondary | ICD-10-CM | POA: Diagnosis not present

## 2021-06-23 ENCOUNTER — Telehealth: Payer: Self-pay

## 2021-06-23 DIAGNOSIS — E1121 Type 2 diabetes mellitus with diabetic nephropathy: Secondary | ICD-10-CM | POA: Diagnosis not present

## 2021-06-23 DIAGNOSIS — E1142 Type 2 diabetes mellitus with diabetic polyneuropathy: Secondary | ICD-10-CM | POA: Diagnosis not present

## 2021-06-23 DIAGNOSIS — K589 Irritable bowel syndrome without diarrhea: Secondary | ICD-10-CM | POA: Diagnosis not present

## 2021-06-23 DIAGNOSIS — G894 Chronic pain syndrome: Secondary | ICD-10-CM | POA: Diagnosis not present

## 2021-06-23 NOTE — Telephone Encounter (Signed)
PA sent on CMM for Vascepa 1 gm. Key [id number]

## 2021-06-23 NOTE — Telephone Encounter (Signed)
PA approved for Vascepa 1 gm from 06/23/21-06/23/22.

## 2021-07-07 DIAGNOSIS — M5416 Radiculopathy, lumbar region: Secondary | ICD-10-CM | POA: Diagnosis not present

## 2021-07-15 ENCOUNTER — Ambulatory Visit: Payer: BLUE CROSS/BLUE SHIELD | Admitting: Cardiology

## 2021-07-15 DIAGNOSIS — L0231 Cutaneous abscess of buttock: Secondary | ICD-10-CM | POA: Diagnosis not present

## 2021-07-15 DIAGNOSIS — L814 Other melanin hyperpigmentation: Secondary | ICD-10-CM | POA: Diagnosis not present

## 2021-07-15 DIAGNOSIS — M810 Age-related osteoporosis without current pathological fracture: Secondary | ICD-10-CM | POA: Diagnosis not present

## 2021-07-15 DIAGNOSIS — L02213 Cutaneous abscess of chest wall: Secondary | ICD-10-CM | POA: Diagnosis not present

## 2021-07-29 DIAGNOSIS — M3500 Sicca syndrome, unspecified: Secondary | ICD-10-CM | POA: Diagnosis not present

## 2021-07-29 DIAGNOSIS — R21 Rash and other nonspecific skin eruption: Secondary | ICD-10-CM | POA: Diagnosis not present

## 2021-07-29 DIAGNOSIS — R768 Other specified abnormal immunological findings in serum: Secondary | ICD-10-CM | POA: Diagnosis not present

## 2021-07-29 DIAGNOSIS — M359 Systemic involvement of connective tissue, unspecified: Secondary | ICD-10-CM | POA: Diagnosis not present

## 2021-08-04 DIAGNOSIS — M5416 Radiculopathy, lumbar region: Secondary | ICD-10-CM | POA: Diagnosis not present

## 2021-09-09 DIAGNOSIS — M5416 Radiculopathy, lumbar region: Secondary | ICD-10-CM | POA: Diagnosis not present

## 2021-09-09 DIAGNOSIS — M47896 Other spondylosis, lumbar region: Secondary | ICD-10-CM | POA: Diagnosis not present

## 2021-09-18 DIAGNOSIS — K219 Gastro-esophageal reflux disease without esophagitis: Secondary | ICD-10-CM | POA: Diagnosis not present

## 2021-09-18 DIAGNOSIS — Z91040 Latex allergy status: Secondary | ICD-10-CM | POA: Diagnosis not present

## 2021-09-18 DIAGNOSIS — Z9049 Acquired absence of other specified parts of digestive tract: Secondary | ICD-10-CM | POA: Diagnosis not present

## 2021-09-18 DIAGNOSIS — Z885 Allergy status to narcotic agent status: Secondary | ICD-10-CM | POA: Diagnosis not present

## 2021-09-18 DIAGNOSIS — R1011 Right upper quadrant pain: Secondary | ICD-10-CM | POA: Diagnosis not present

## 2021-09-18 DIAGNOSIS — Z881 Allergy status to other antibiotic agents status: Secondary | ICD-10-CM | POA: Diagnosis not present

## 2021-09-18 DIAGNOSIS — K529 Noninfective gastroenteritis and colitis, unspecified: Secondary | ICD-10-CM | POA: Diagnosis not present

## 2021-09-18 DIAGNOSIS — Z886 Allergy status to analgesic agent status: Secondary | ICD-10-CM | POA: Diagnosis not present

## 2021-09-18 DIAGNOSIS — Z888 Allergy status to other drugs, medicaments and biological substances status: Secondary | ICD-10-CM | POA: Diagnosis not present

## 2021-09-18 DIAGNOSIS — K3184 Gastroparesis: Secondary | ICD-10-CM | POA: Diagnosis not present

## 2021-09-18 DIAGNOSIS — R1032 Left lower quadrant pain: Secondary | ICD-10-CM | POA: Diagnosis not present

## 2021-09-25 DIAGNOSIS — R1032 Left lower quadrant pain: Secondary | ICD-10-CM | POA: Diagnosis not present

## 2021-09-29 DIAGNOSIS — R600 Localized edema: Secondary | ICD-10-CM | POA: Diagnosis not present

## 2021-09-29 DIAGNOSIS — E1121 Type 2 diabetes mellitus with diabetic nephropathy: Secondary | ICD-10-CM | POA: Diagnosis not present

## 2021-09-29 DIAGNOSIS — S6992XA Unspecified injury of left wrist, hand and finger(s), initial encounter: Secondary | ICD-10-CM | POA: Diagnosis not present

## 2021-09-29 DIAGNOSIS — Z6833 Body mass index (BMI) 33.0-33.9, adult: Secondary | ICD-10-CM | POA: Diagnosis not present

## 2021-10-07 DIAGNOSIS — K429 Umbilical hernia without obstruction or gangrene: Secondary | ICD-10-CM | POA: Diagnosis not present

## 2021-10-08 DIAGNOSIS — R112 Nausea with vomiting, unspecified: Secondary | ICD-10-CM | POA: Diagnosis not present

## 2021-10-08 DIAGNOSIS — R1084 Generalized abdominal pain: Secondary | ICD-10-CM | POA: Diagnosis not present

## 2021-10-08 DIAGNOSIS — R197 Diarrhea, unspecified: Secondary | ICD-10-CM | POA: Diagnosis not present

## 2021-10-08 DIAGNOSIS — R1011 Right upper quadrant pain: Secondary | ICD-10-CM | POA: Diagnosis not present

## 2021-10-12 ENCOUNTER — Ambulatory Visit: Payer: BLUE CROSS/BLUE SHIELD | Admitting: Cardiology

## 2021-10-19 DIAGNOSIS — K7401 Hepatic fibrosis, early fibrosis: Secondary | ICD-10-CM | POA: Diagnosis not present

## 2021-10-19 DIAGNOSIS — K760 Fatty (change of) liver, not elsewhere classified: Secondary | ICD-10-CM | POA: Diagnosis not present

## 2021-10-29 DIAGNOSIS — K580 Irritable bowel syndrome with diarrhea: Secondary | ICD-10-CM | POA: Diagnosis not present

## 2021-10-29 DIAGNOSIS — Z6833 Body mass index (BMI) 33.0-33.9, adult: Secondary | ICD-10-CM | POA: Diagnosis not present

## 2021-10-29 DIAGNOSIS — R21 Rash and other nonspecific skin eruption: Secondary | ICD-10-CM | POA: Diagnosis not present

## 2021-10-29 DIAGNOSIS — K219 Gastro-esophageal reflux disease without esophagitis: Secondary | ICD-10-CM | POA: Diagnosis not present

## 2021-11-02 DIAGNOSIS — S32010A Wedge compression fracture of first lumbar vertebra, initial encounter for closed fracture: Secondary | ICD-10-CM | POA: Diagnosis not present

## 2021-11-02 DIAGNOSIS — M5459 Other low back pain: Secondary | ICD-10-CM | POA: Diagnosis not present

## 2021-12-08 DIAGNOSIS — Z8719 Personal history of other diseases of the digestive system: Secondary | ICD-10-CM | POA: Diagnosis not present

## 2021-12-08 DIAGNOSIS — R1319 Other dysphagia: Secondary | ICD-10-CM | POA: Diagnosis not present

## 2021-12-08 DIAGNOSIS — R0989 Other specified symptoms and signs involving the circulatory and respiratory systems: Secondary | ICD-10-CM | POA: Diagnosis not present

## 2021-12-08 DIAGNOSIS — R110 Nausea: Secondary | ICD-10-CM | POA: Diagnosis not present

## 2021-12-11 DIAGNOSIS — Z6839 Body mass index (BMI) 39.0-39.9, adult: Secondary | ICD-10-CM | POA: Diagnosis not present

## 2021-12-11 DIAGNOSIS — E669 Obesity, unspecified: Secondary | ICD-10-CM | POA: Diagnosis not present

## 2021-12-11 DIAGNOSIS — K3184 Gastroparesis: Secondary | ICD-10-CM | POA: Diagnosis not present

## 2021-12-14 DIAGNOSIS — R110 Nausea: Secondary | ICD-10-CM | POA: Diagnosis not present

## 2021-12-14 DIAGNOSIS — K219 Gastro-esophageal reflux disease without esophagitis: Secondary | ICD-10-CM | POA: Diagnosis not present

## 2021-12-14 DIAGNOSIS — R1319 Other dysphagia: Secondary | ICD-10-CM | POA: Diagnosis not present

## 2022-01-07 DIAGNOSIS — R1032 Left lower quadrant pain: Secondary | ICD-10-CM | POA: Diagnosis not present

## 2022-02-08 DIAGNOSIS — K219 Gastro-esophageal reflux disease without esophagitis: Secondary | ICD-10-CM | POA: Diagnosis not present

## 2022-02-08 DIAGNOSIS — E782 Mixed hyperlipidemia: Secondary | ICD-10-CM | POA: Diagnosis not present

## 2022-02-08 DIAGNOSIS — R0609 Other forms of dyspnea: Secondary | ICD-10-CM | POA: Diagnosis not present

## 2022-02-08 DIAGNOSIS — E1142 Type 2 diabetes mellitus with diabetic polyneuropathy: Secondary | ICD-10-CM | POA: Diagnosis not present

## 2022-02-08 DIAGNOSIS — R600 Localized edema: Secondary | ICD-10-CM | POA: Diagnosis not present

## 2022-02-08 DIAGNOSIS — E1121 Type 2 diabetes mellitus with diabetic nephropathy: Secondary | ICD-10-CM | POA: Diagnosis not present

## 2022-02-23 DIAGNOSIS — X58XXXA Exposure to other specified factors, initial encounter: Secondary | ICD-10-CM | POA: Diagnosis not present

## 2022-02-23 DIAGNOSIS — M5136 Other intervertebral disc degeneration, lumbar region: Secondary | ICD-10-CM | POA: Diagnosis not present

## 2022-02-23 DIAGNOSIS — M47814 Spondylosis without myelopathy or radiculopathy, thoracic region: Secondary | ICD-10-CM | POA: Diagnosis not present

## 2022-02-23 DIAGNOSIS — M5137 Other intervertebral disc degeneration, lumbosacral region: Secondary | ICD-10-CM | POA: Diagnosis not present

## 2022-02-23 DIAGNOSIS — S32010A Wedge compression fracture of first lumbar vertebra, initial encounter for closed fracture: Secondary | ICD-10-CM | POA: Diagnosis not present

## 2022-02-23 DIAGNOSIS — M159 Polyosteoarthritis, unspecified: Secondary | ICD-10-CM | POA: Diagnosis not present

## 2022-03-02 DIAGNOSIS — E119 Type 2 diabetes mellitus without complications: Secondary | ICD-10-CM | POA: Diagnosis not present

## 2022-04-05 DIAGNOSIS — H40013 Open angle with borderline findings, low risk, bilateral: Secondary | ICD-10-CM | POA: Diagnosis not present

## 2022-04-06 DIAGNOSIS — M7989 Other specified soft tissue disorders: Secondary | ICD-10-CM | POA: Diagnosis not present

## 2022-04-06 DIAGNOSIS — L738 Other specified follicular disorders: Secondary | ICD-10-CM | POA: Diagnosis not present

## 2022-04-06 DIAGNOSIS — M3500 Sicca syndrome, unspecified: Secondary | ICD-10-CM | POA: Diagnosis not present

## 2022-04-06 DIAGNOSIS — E1142 Type 2 diabetes mellitus with diabetic polyneuropathy: Secondary | ICD-10-CM | POA: Diagnosis not present

## 2022-04-13 DIAGNOSIS — M5414 Radiculopathy, thoracic region: Secondary | ICD-10-CM | POA: Diagnosis not present

## 2022-04-13 DIAGNOSIS — S32018A Other fracture of first lumbar vertebra, initial encounter for closed fracture: Secondary | ICD-10-CM | POA: Diagnosis not present

## 2022-04-13 DIAGNOSIS — Z9889 Other specified postprocedural states: Secondary | ICD-10-CM | POA: Diagnosis not present

## 2022-04-14 DIAGNOSIS — E1142 Type 2 diabetes mellitus with diabetic polyneuropathy: Secondary | ICD-10-CM | POA: Diagnosis not present

## 2022-04-14 DIAGNOSIS — Z794 Long term (current) use of insulin: Secondary | ICD-10-CM | POA: Diagnosis not present

## 2022-04-14 DIAGNOSIS — R300 Dysuria: Secondary | ICD-10-CM | POA: Diagnosis not present

## 2022-04-14 DIAGNOSIS — Z0000 Encounter for general adult medical examination without abnormal findings: Secondary | ICD-10-CM | POA: Diagnosis not present

## 2022-04-14 DIAGNOSIS — E781 Pure hyperglyceridemia: Secondary | ICD-10-CM | POA: Diagnosis not present

## 2022-04-14 DIAGNOSIS — Z1329 Encounter for screening for other suspected endocrine disorder: Secondary | ICD-10-CM | POA: Diagnosis not present

## 2022-04-15 DIAGNOSIS — E1142 Type 2 diabetes mellitus with diabetic polyneuropathy: Secondary | ICD-10-CM | POA: Diagnosis not present

## 2022-04-15 DIAGNOSIS — Z794 Long term (current) use of insulin: Secondary | ICD-10-CM | POA: Diagnosis not present

## 2022-04-15 DIAGNOSIS — E781 Pure hyperglyceridemia: Secondary | ICD-10-CM | POA: Diagnosis not present

## 2022-04-15 DIAGNOSIS — K529 Noninfective gastroenteritis and colitis, unspecified: Secondary | ICD-10-CM | POA: Diagnosis not present

## 2022-04-15 DIAGNOSIS — Z720 Tobacco use: Secondary | ICD-10-CM | POA: Diagnosis not present

## 2022-04-15 DIAGNOSIS — I959 Hypotension, unspecified: Secondary | ICD-10-CM | POA: Diagnosis not present

## 2022-04-16 DIAGNOSIS — E781 Pure hyperglyceridemia: Secondary | ICD-10-CM | POA: Diagnosis not present

## 2022-04-21 DIAGNOSIS — R109 Unspecified abdominal pain: Secondary | ICD-10-CM | POA: Diagnosis not present

## 2022-04-21 DIAGNOSIS — R300 Dysuria: Secondary | ICD-10-CM | POA: Diagnosis not present

## 2022-04-21 DIAGNOSIS — E781 Pure hyperglyceridemia: Secondary | ICD-10-CM | POA: Diagnosis not present

## 2022-04-21 DIAGNOSIS — R8290 Unspecified abnormal findings in urine: Secondary | ICD-10-CM | POA: Diagnosis not present

## 2022-04-23 DIAGNOSIS — E119 Type 2 diabetes mellitus without complications: Secondary | ICD-10-CM | POA: Diagnosis not present

## 2022-04-23 DIAGNOSIS — L603 Nail dystrophy: Secondary | ICD-10-CM | POA: Diagnosis not present

## 2022-04-23 DIAGNOSIS — E1142 Type 2 diabetes mellitus with diabetic polyneuropathy: Secondary | ICD-10-CM | POA: Diagnosis not present

## 2022-04-23 DIAGNOSIS — Z794 Long term (current) use of insulin: Secondary | ICD-10-CM | POA: Diagnosis not present

## 2022-04-23 DIAGNOSIS — B351 Tinea unguium: Secondary | ICD-10-CM | POA: Diagnosis not present

## 2022-05-10 DIAGNOSIS — R152 Fecal urgency: Secondary | ICD-10-CM | POA: Diagnosis not present

## 2022-05-10 DIAGNOSIS — K3184 Gastroparesis: Secondary | ICD-10-CM | POA: Diagnosis not present

## 2022-05-10 DIAGNOSIS — R159 Full incontinence of feces: Secondary | ICD-10-CM | POA: Diagnosis not present

## 2022-05-10 DIAGNOSIS — K580 Irritable bowel syndrome with diarrhea: Secondary | ICD-10-CM | POA: Diagnosis not present

## 2022-05-18 DIAGNOSIS — S32010A Wedge compression fracture of first lumbar vertebra, initial encounter for closed fracture: Secondary | ICD-10-CM | POA: Diagnosis not present

## 2022-06-01 DIAGNOSIS — Z720 Tobacco use: Secondary | ICD-10-CM | POA: Diagnosis not present

## 2022-06-01 DIAGNOSIS — E781 Pure hyperglyceridemia: Secondary | ICD-10-CM | POA: Diagnosis not present

## 2022-06-01 DIAGNOSIS — I959 Hypotension, unspecified: Secondary | ICD-10-CM | POA: Diagnosis not present

## 2022-07-04 IMAGING — CR DG CHEST 2V
2 series · 2 of 2 positions shown · non-contrast
Comparison: None.

CLINICAL DATA: Patient for L1 kyphoplasty tomorrow. Preoperative
exam.

EXAM:
CHEST - 2 VIEW

[w chest pa]
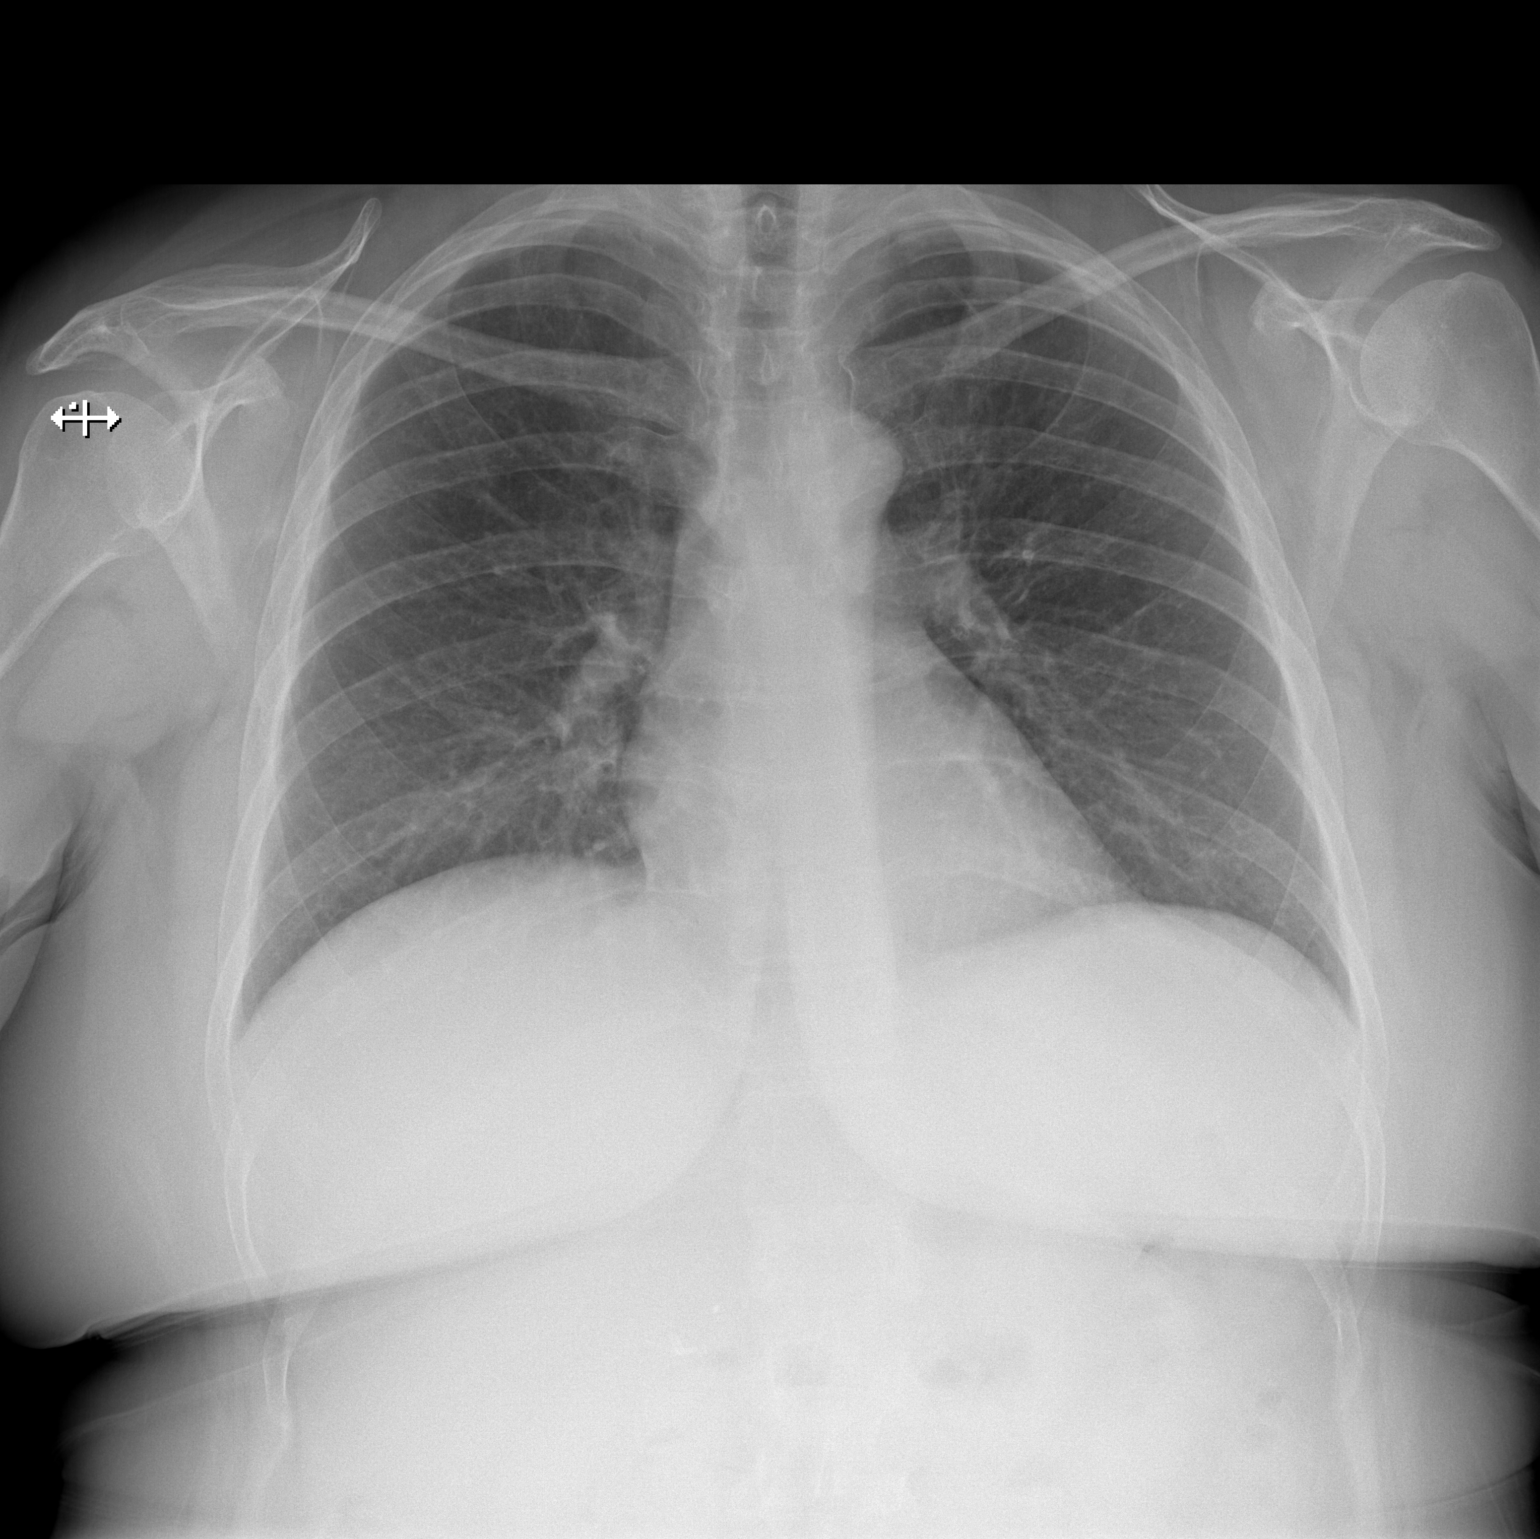

[w chest lat]
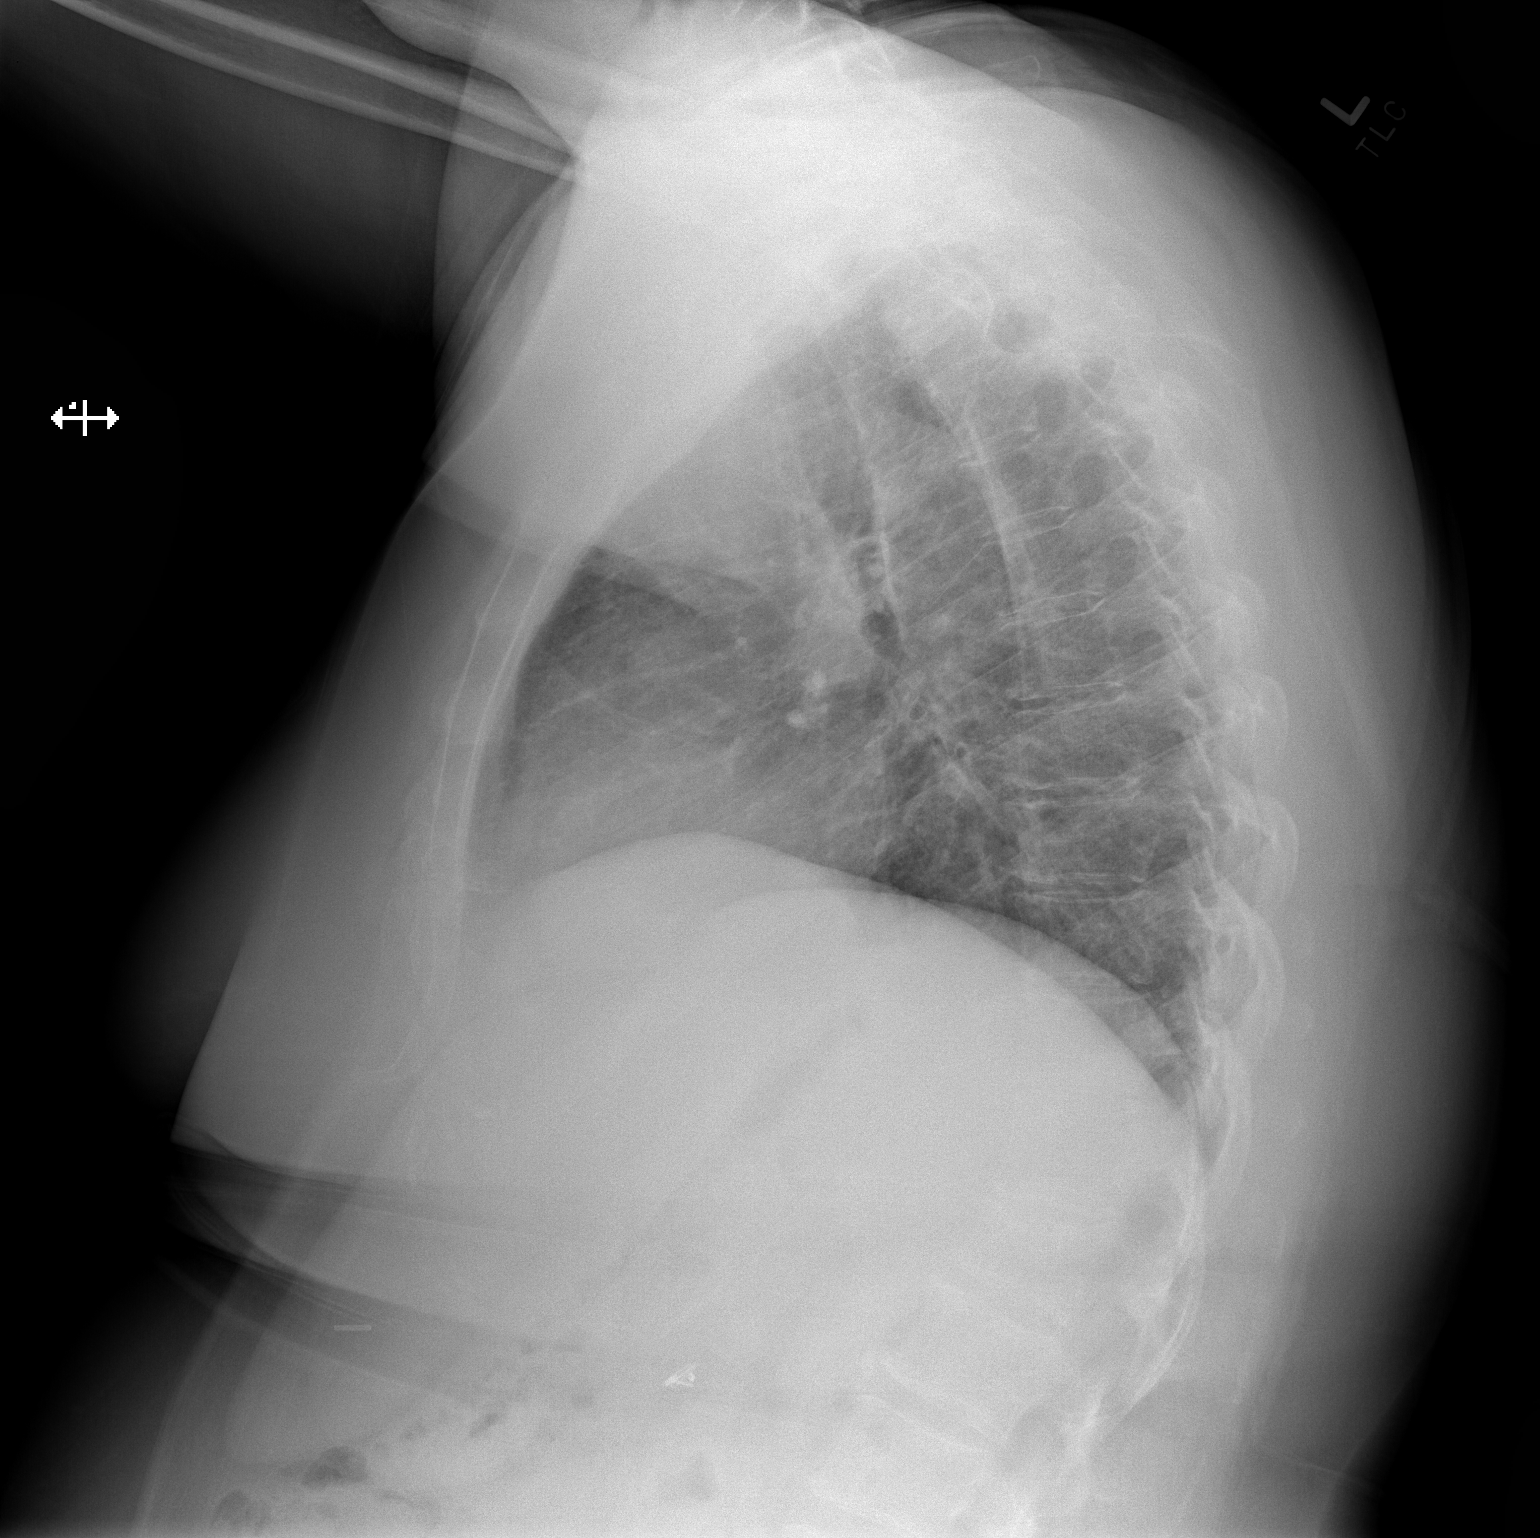

[2 of 2 positions shown; findings below may reference images not displayed]

FINDINGS: Lungs clear. Heart size normal. No pneumothorax or pleural fluid. L1
compression fracture noted.
IMPRESSION: No acute cardiopulmonary disease.

L1 compression fracture.

## 2022-07-05 IMAGING — RF DG LUMBAR SPINE 2-3V
1 series · 1 of 1 positions shown · non-contrast
Comparison: None.

CLINICAL DATA: L1 kyphoplasty.

EXAM:
DG C-ARM 1-60 MIN; LUMBAR SPINE - 2-3 VIEW
FLUOROSCOPY TIME:  Total fluoroscopy Time:
Total radiation Exposure Index (if provided by the fluoroscopic
device): 62.8 mGy.

[Series 1: run · 1 of 1 slices shown]
[im 1/1]
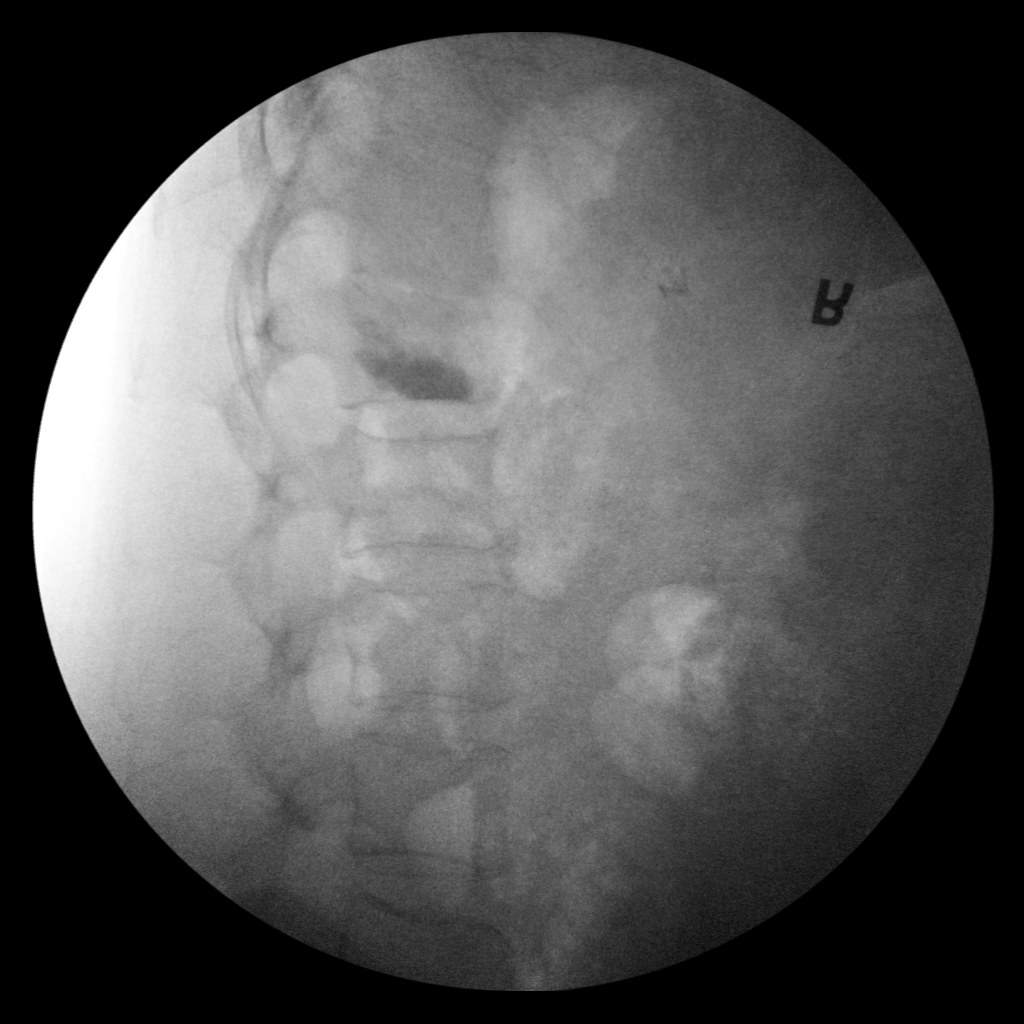

[1 of 1 positions shown; findings below may reference images not displayed]

FINDINGS: Two C-arm fluoroscopic images were obtained intraoperatively and
submitted for post operative interpretation. These images
demonstrate surgical change of kyphoplasty, presumably at L1
although the small field of view limits accurate evaluation of the
level. No evidence of extrusion of cement. No unexpected findings.
Please see the performing provider's procedural report for further
detail.
IMPRESSION: Intraoperative fluoroscopy, as detailed above.

## 2022-07-22 DIAGNOSIS — M6289 Other specified disorders of muscle: Secondary | ICD-10-CM | POA: Diagnosis not present

## 2022-07-22 DIAGNOSIS — R159 Full incontinence of feces: Secondary | ICD-10-CM | POA: Diagnosis not present

## 2022-08-03 DIAGNOSIS — Z794 Long term (current) use of insulin: Secondary | ICD-10-CM | POA: Diagnosis not present

## 2022-08-03 DIAGNOSIS — E781 Pure hyperglyceridemia: Secondary | ICD-10-CM | POA: Diagnosis not present

## 2022-08-03 DIAGNOSIS — F17210 Nicotine dependence, cigarettes, uncomplicated: Secondary | ICD-10-CM | POA: Diagnosis not present

## 2022-08-03 DIAGNOSIS — R079 Chest pain, unspecified: Secondary | ICD-10-CM | POA: Diagnosis not present

## 2022-08-03 DIAGNOSIS — I25111 Atherosclerotic heart disease of native coronary artery with angina pectoris with documented spasm: Secondary | ICD-10-CM | POA: Diagnosis not present

## 2022-08-03 DIAGNOSIS — I951 Orthostatic hypotension: Secondary | ICD-10-CM | POA: Diagnosis not present

## 2022-08-03 DIAGNOSIS — E119 Type 2 diabetes mellitus without complications: Secondary | ICD-10-CM | POA: Diagnosis not present

## 2022-08-03 DIAGNOSIS — F419 Anxiety disorder, unspecified: Secondary | ICD-10-CM | POA: Diagnosis not present

## 2022-08-03 DIAGNOSIS — I2121 ST elevation (STEMI) myocardial infarction involving left circumflex coronary artery: Secondary | ICD-10-CM | POA: Diagnosis not present

## 2022-08-03 DIAGNOSIS — I2510 Atherosclerotic heart disease of native coronary artery without angina pectoris: Secondary | ICD-10-CM | POA: Diagnosis not present

## 2022-08-03 DIAGNOSIS — Z833 Family history of diabetes mellitus: Secondary | ICD-10-CM | POA: Diagnosis not present

## 2022-08-03 DIAGNOSIS — I340 Nonrheumatic mitral (valve) insufficiency: Secondary | ICD-10-CM | POA: Diagnosis not present

## 2022-08-03 DIAGNOSIS — E1142 Type 2 diabetes mellitus with diabetic polyneuropathy: Secondary | ICD-10-CM | POA: Diagnosis not present

## 2022-08-03 DIAGNOSIS — Z8541 Personal history of malignant neoplasm of cervix uteri: Secondary | ICD-10-CM | POA: Diagnosis not present

## 2022-08-03 DIAGNOSIS — I2119 ST elevation (STEMI) myocardial infarction involving other coronary artery of inferior wall: Secondary | ICD-10-CM | POA: Diagnosis not present

## 2022-08-03 DIAGNOSIS — I959 Hypotension, unspecified: Secondary | ICD-10-CM | POA: Diagnosis not present

## 2022-08-03 DIAGNOSIS — Z9889 Other specified postprocedural states: Secondary | ICD-10-CM | POA: Diagnosis not present

## 2022-08-03 DIAGNOSIS — I213 ST elevation (STEMI) myocardial infarction of unspecified site: Secondary | ICD-10-CM | POA: Diagnosis not present

## 2022-08-03 DIAGNOSIS — Z8249 Family history of ischemic heart disease and other diseases of the circulatory system: Secondary | ICD-10-CM | POA: Diagnosis not present

## 2022-08-03 DIAGNOSIS — Z720 Tobacco use: Secondary | ICD-10-CM | POA: Diagnosis not present

## 2022-08-03 DIAGNOSIS — E785 Hyperlipidemia, unspecified: Secondary | ICD-10-CM | POA: Diagnosis not present

## 2022-08-03 DIAGNOSIS — R9431 Abnormal electrocardiogram [ECG] [EKG]: Secondary | ICD-10-CM | POA: Diagnosis not present

## 2022-08-03 DIAGNOSIS — Z8542 Personal history of malignant neoplasm of other parts of uterus: Secondary | ICD-10-CM | POA: Diagnosis not present

## 2022-08-03 DIAGNOSIS — Z841 Family history of disorders of kidney and ureter: Secondary | ICD-10-CM | POA: Diagnosis not present

## 2022-08-03 DIAGNOSIS — Z853 Personal history of malignant neoplasm of breast: Secondary | ICD-10-CM | POA: Diagnosis not present

## 2022-08-03 DIAGNOSIS — G8929 Other chronic pain: Secondary | ICD-10-CM | POA: Diagnosis not present

## 2022-08-03 DIAGNOSIS — F32A Depression, unspecified: Secondary | ICD-10-CM | POA: Diagnosis not present

## 2022-08-17 DIAGNOSIS — R058 Other specified cough: Secondary | ICD-10-CM | POA: Diagnosis not present

## 2022-08-17 DIAGNOSIS — Z794 Long term (current) use of insulin: Secondary | ICD-10-CM | POA: Diagnosis not present

## 2022-08-17 DIAGNOSIS — E1169 Type 2 diabetes mellitus with other specified complication: Secondary | ICD-10-CM | POA: Diagnosis not present

## 2022-08-17 DIAGNOSIS — R350 Frequency of micturition: Secondary | ICD-10-CM | POA: Diagnosis not present

## 2022-08-20 DIAGNOSIS — R059 Cough, unspecified: Secondary | ICD-10-CM | POA: Diagnosis not present

## 2022-08-20 DIAGNOSIS — R058 Other specified cough: Secondary | ICD-10-CM | POA: Diagnosis not present

## 2022-08-20 DIAGNOSIS — R0600 Dyspnea, unspecified: Secondary | ICD-10-CM | POA: Diagnosis not present

## 2022-08-23 ENCOUNTER — Ambulatory Visit (HOSPITAL_BASED_OUTPATIENT_CLINIC_OR_DEPARTMENT_OTHER): Payer: BLUE CROSS/BLUE SHIELD | Admitting: Internal Medicine

## 2022-08-27 DIAGNOSIS — I2121 ST elevation (STEMI) myocardial infarction involving left circumflex coronary artery: Secondary | ICD-10-CM | POA: Diagnosis not present

## 2022-08-27 DIAGNOSIS — Z794 Long term (current) use of insulin: Secondary | ICD-10-CM | POA: Diagnosis not present

## 2022-08-27 DIAGNOSIS — R000 Tachycardia, unspecified: Secondary | ICD-10-CM | POA: Diagnosis not present

## 2022-08-27 DIAGNOSIS — R002 Palpitations: Secondary | ICD-10-CM | POA: Diagnosis not present

## 2022-08-27 DIAGNOSIS — Z955 Presence of coronary angioplasty implant and graft: Secondary | ICD-10-CM | POA: Diagnosis not present

## 2022-08-27 DIAGNOSIS — E1169 Type 2 diabetes mellitus with other specified complication: Secondary | ICD-10-CM | POA: Diagnosis not present

## 2022-08-27 DIAGNOSIS — Z720 Tobacco use: Secondary | ICD-10-CM | POA: Diagnosis not present

## 2022-08-27 DIAGNOSIS — E781 Pure hyperglyceridemia: Secondary | ICD-10-CM | POA: Diagnosis not present

## 2022-09-01 DIAGNOSIS — R350 Frequency of micturition: Secondary | ICD-10-CM | POA: Diagnosis not present

## 2022-10-04 DIAGNOSIS — R000 Tachycardia, unspecified: Secondary | ICD-10-CM | POA: Diagnosis not present

## 2022-10-05 DIAGNOSIS — I491 Atrial premature depolarization: Secondary | ICD-10-CM | POA: Diagnosis not present

## 2022-10-05 DIAGNOSIS — I493 Ventricular premature depolarization: Secondary | ICD-10-CM | POA: Diagnosis not present

## 2022-10-05 DIAGNOSIS — I4719 Other supraventricular tachycardia: Secondary | ICD-10-CM | POA: Diagnosis not present

## 2022-11-22 DIAGNOSIS — Z720 Tobacco use: Secondary | ICD-10-CM | POA: Diagnosis not present

## 2022-11-22 DIAGNOSIS — I2121 ST elevation (STEMI) myocardial infarction involving left circumflex coronary artery: Secondary | ICD-10-CM | POA: Diagnosis not present

## 2022-11-22 DIAGNOSIS — I252 Old myocardial infarction: Secondary | ICD-10-CM | POA: Diagnosis not present

## 2022-11-22 DIAGNOSIS — R002 Palpitations: Secondary | ICD-10-CM | POA: Diagnosis not present

## 2022-11-25 DIAGNOSIS — N3000 Acute cystitis without hematuria: Secondary | ICD-10-CM | POA: Diagnosis not present

## 2022-11-25 DIAGNOSIS — E871 Hypo-osmolality and hyponatremia: Secondary | ICD-10-CM | POA: Diagnosis not present

## 2022-11-25 DIAGNOSIS — R600 Localized edema: Secondary | ICD-10-CM | POA: Diagnosis not present

## 2022-11-25 DIAGNOSIS — E1142 Type 2 diabetes mellitus with diabetic polyneuropathy: Secondary | ICD-10-CM | POA: Diagnosis not present

## 2022-12-10 DIAGNOSIS — M5416 Radiculopathy, lumbar region: Secondary | ICD-10-CM | POA: Diagnosis not present

## 2022-12-10 DIAGNOSIS — M545 Low back pain, unspecified: Secondary | ICD-10-CM | POA: Diagnosis not present

## 2022-12-10 DIAGNOSIS — M549 Dorsalgia, unspecified: Secondary | ICD-10-CM | POA: Diagnosis not present

## 2022-12-10 DIAGNOSIS — G8929 Other chronic pain: Secondary | ICD-10-CM | POA: Diagnosis not present

## 2022-12-10 DIAGNOSIS — M546 Pain in thoracic spine: Secondary | ICD-10-CM | POA: Diagnosis not present

## 2022-12-10 DIAGNOSIS — S32010A Wedge compression fracture of first lumbar vertebra, initial encounter for closed fracture: Secondary | ICD-10-CM | POA: Diagnosis not present

## 2022-12-10 DIAGNOSIS — M47816 Spondylosis without myelopathy or radiculopathy, lumbar region: Secondary | ICD-10-CM | POA: Diagnosis not present

## 2022-12-23 DIAGNOSIS — M545 Low back pain, unspecified: Secondary | ICD-10-CM | POA: Diagnosis not present

## 2022-12-23 DIAGNOSIS — M5416 Radiculopathy, lumbar region: Secondary | ICD-10-CM | POA: Diagnosis not present

## 2022-12-23 DIAGNOSIS — S32010A Wedge compression fracture of first lumbar vertebra, initial encounter for closed fracture: Secondary | ICD-10-CM | POA: Diagnosis not present

## 2023-01-14 DIAGNOSIS — R002 Palpitations: Secondary | ICD-10-CM | POA: Diagnosis not present

## 2023-01-14 DIAGNOSIS — I471 Supraventricular tachycardia, unspecified: Secondary | ICD-10-CM | POA: Diagnosis not present

## 2023-01-14 DIAGNOSIS — I2121 ST elevation (STEMI) myocardial infarction involving left circumflex coronary artery: Secondary | ICD-10-CM | POA: Diagnosis not present

## 2023-01-14 DIAGNOSIS — Z955 Presence of coronary angioplasty implant and graft: Secondary | ICD-10-CM | POA: Diagnosis not present

## 2023-01-27 DIAGNOSIS — M25551 Pain in right hip: Secondary | ICD-10-CM | POA: Diagnosis not present

## 2023-01-27 DIAGNOSIS — G8929 Other chronic pain: Secondary | ICD-10-CM | POA: Diagnosis not present

## 2023-01-27 DIAGNOSIS — M25512 Pain in left shoulder: Secondary | ICD-10-CM | POA: Diagnosis not present

## 2023-01-27 DIAGNOSIS — E1149 Type 2 diabetes mellitus with other diabetic neurological complication: Secondary | ICD-10-CM | POA: Diagnosis not present

## 2023-01-27 DIAGNOSIS — M25511 Pain in right shoulder: Secondary | ICD-10-CM | POA: Diagnosis not present

## 2023-01-27 DIAGNOSIS — M797 Fibromyalgia: Secondary | ICD-10-CM | POA: Diagnosis not present

## 2023-02-01 DIAGNOSIS — M1611 Unilateral primary osteoarthritis, right hip: Secondary | ICD-10-CM | POA: Diagnosis not present

## 2023-02-01 DIAGNOSIS — M19012 Primary osteoarthritis, left shoulder: Secondary | ICD-10-CM | POA: Diagnosis not present

## 2023-02-01 DIAGNOSIS — M25551 Pain in right hip: Secondary | ICD-10-CM | POA: Diagnosis not present

## 2023-02-01 DIAGNOSIS — M19011 Primary osteoarthritis, right shoulder: Secondary | ICD-10-CM | POA: Diagnosis not present

## 2023-02-01 DIAGNOSIS — M25512 Pain in left shoulder: Secondary | ICD-10-CM | POA: Diagnosis not present

## 2023-02-01 DIAGNOSIS — M25511 Pain in right shoulder: Secondary | ICD-10-CM | POA: Diagnosis not present

## 2023-02-01 DIAGNOSIS — G8929 Other chronic pain: Secondary | ICD-10-CM | POA: Diagnosis not present

## 2023-02-17 DIAGNOSIS — M7061 Trochanteric bursitis, right hip: Secondary | ICD-10-CM | POA: Diagnosis not present

## 2023-02-17 DIAGNOSIS — M5416 Radiculopathy, lumbar region: Secondary | ICD-10-CM | POA: Diagnosis not present

## 2023-02-17 DIAGNOSIS — S32010A Wedge compression fracture of first lumbar vertebra, initial encounter for closed fracture: Secondary | ICD-10-CM | POA: Diagnosis not present

## 2023-02-23 DIAGNOSIS — Z720 Tobacco use: Secondary | ICD-10-CM | POA: Diagnosis not present

## 2023-02-23 DIAGNOSIS — E1142 Type 2 diabetes mellitus with diabetic polyneuropathy: Secondary | ICD-10-CM | POA: Diagnosis not present

## 2023-02-23 DIAGNOSIS — K219 Gastro-esophageal reflux disease without esophagitis: Secondary | ICD-10-CM | POA: Diagnosis not present

## 2023-02-23 DIAGNOSIS — I471 Supraventricular tachycardia, unspecified: Secondary | ICD-10-CM | POA: Diagnosis not present

## 2023-02-23 DIAGNOSIS — R000 Tachycardia, unspecified: Secondary | ICD-10-CM | POA: Diagnosis not present

## 2023-03-15 DIAGNOSIS — R152 Fecal urgency: Secondary | ICD-10-CM | POA: Diagnosis not present

## 2023-03-15 DIAGNOSIS — R159 Full incontinence of feces: Secondary | ICD-10-CM | POA: Diagnosis not present

## 2023-03-30 DIAGNOSIS — E119 Type 2 diabetes mellitus without complications: Secondary | ICD-10-CM | POA: Diagnosis not present

## 2023-03-30 DIAGNOSIS — Z7985 Long-term (current) use of injectable non-insulin antidiabetic drugs: Secondary | ICD-10-CM | POA: Diagnosis not present

## 2023-03-30 DIAGNOSIS — E781 Pure hyperglyceridemia: Secondary | ICD-10-CM | POA: Diagnosis not present

## 2023-03-30 DIAGNOSIS — Z794 Long term (current) use of insulin: Secondary | ICD-10-CM | POA: Diagnosis not present
